# Patient Record
Sex: Female | Born: 1960 | Race: Black or African American | Hispanic: No | Smoking: Former smoker
Health system: Southern US, Community
[De-identification: ages and names within clinical notes are randomized; demographics above are authoritative.]

## PROBLEM LIST (undated history)

## (undated) DIAGNOSIS — E785 Hyperlipidemia, unspecified: Secondary | ICD-10-CM

## (undated) DIAGNOSIS — M1990 Unspecified osteoarthritis, unspecified site: Secondary | ICD-10-CM

## (undated) DIAGNOSIS — I10 Essential (primary) hypertension: Secondary | ICD-10-CM

## (undated) DIAGNOSIS — E669 Obesity, unspecified: Secondary | ICD-10-CM

## (undated) DIAGNOSIS — F319 Bipolar disorder, unspecified: Secondary | ICD-10-CM

## (undated) HISTORY — DX: Obesity, unspecified: E66.9

## (undated) HISTORY — PX: ABDOMINAL HYSTERECTOMY: SHX81

## (undated) HISTORY — DX: Hyperlipidemia, unspecified: E78.5

## (undated) HISTORY — DX: Essential (primary) hypertension: I10

## (undated) HISTORY — DX: Bipolar disorder, unspecified: F31.9

## (undated) HISTORY — DX: Unspecified osteoarthritis, unspecified site: M19.90

---

## 1998-11-03 ENCOUNTER — Emergency Department (HOSPITAL_COMMUNITY): Admission: EM | Admit: 1998-11-03 | Discharge: 1998-11-03 | Payer: Self-pay | Admitting: Emergency Medicine

## 1998-11-03 ENCOUNTER — Encounter: Payer: Self-pay | Admitting: Emergency Medicine

## 2000-09-01 ENCOUNTER — Emergency Department (HOSPITAL_COMMUNITY): Admission: EM | Admit: 2000-09-01 | Discharge: 2000-09-01 | Payer: Self-pay | Admitting: Internal Medicine

## 2000-10-05 ENCOUNTER — Emergency Department (HOSPITAL_COMMUNITY): Admission: EM | Admit: 2000-10-05 | Discharge: 2000-10-05 | Payer: Self-pay | Admitting: Emergency Medicine

## 2002-08-02 ENCOUNTER — Emergency Department (HOSPITAL_COMMUNITY): Admission: EM | Admit: 2002-08-02 | Discharge: 2002-08-02 | Payer: Self-pay | Admitting: *Deleted

## 2002-08-02 ENCOUNTER — Encounter: Payer: Self-pay | Admitting: *Deleted

## 2004-03-12 ENCOUNTER — Emergency Department (HOSPITAL_COMMUNITY): Admission: EM | Admit: 2004-03-12 | Discharge: 2004-03-13 | Payer: Self-pay | Admitting: Emergency Medicine

## 2004-11-07 ENCOUNTER — Emergency Department (HOSPITAL_COMMUNITY): Admission: EM | Admit: 2004-11-07 | Discharge: 2004-11-07 | Payer: Self-pay | Admitting: Family Medicine

## 2005-12-18 ENCOUNTER — Emergency Department (HOSPITAL_COMMUNITY): Admission: EM | Admit: 2005-12-18 | Discharge: 2005-12-18 | Payer: Self-pay | Admitting: Emergency Medicine

## 2005-12-23 ENCOUNTER — Emergency Department (HOSPITAL_COMMUNITY): Admission: EM | Admit: 2005-12-23 | Discharge: 2005-12-23 | Payer: Self-pay | Admitting: Emergency Medicine

## 2005-12-24 ENCOUNTER — Emergency Department (HOSPITAL_COMMUNITY): Admission: EM | Admit: 2005-12-24 | Discharge: 2005-12-24 | Payer: Self-pay | Admitting: Emergency Medicine

## 2006-01-07 ENCOUNTER — Emergency Department (HOSPITAL_COMMUNITY): Admission: EM | Admit: 2006-01-07 | Discharge: 2006-01-07 | Payer: Self-pay | Admitting: Family Medicine

## 2006-02-19 ENCOUNTER — Emergency Department (HOSPITAL_COMMUNITY): Admission: EM | Admit: 2006-02-19 | Discharge: 2006-02-19 | Payer: Self-pay | Admitting: Emergency Medicine

## 2006-05-05 ENCOUNTER — Emergency Department (HOSPITAL_COMMUNITY): Admission: EM | Admit: 2006-05-05 | Discharge: 2006-05-05 | Payer: Self-pay | Admitting: Family Medicine

## 2006-06-05 ENCOUNTER — Emergency Department (HOSPITAL_COMMUNITY): Admission: EM | Admit: 2006-06-05 | Discharge: 2006-06-05 | Payer: Self-pay | Admitting: Family Medicine

## 2006-06-28 ENCOUNTER — Emergency Department (HOSPITAL_COMMUNITY): Admission: EM | Admit: 2006-06-28 | Discharge: 2006-06-28 | Payer: Self-pay | Admitting: Emergency Medicine

## 2006-07-02 ENCOUNTER — Ambulatory Visit: Payer: Self-pay | Admitting: Internal Medicine

## 2006-09-21 DIAGNOSIS — I10 Essential (primary) hypertension: Secondary | ICD-10-CM | POA: Insufficient documentation

## 2006-09-21 DIAGNOSIS — F3160 Bipolar disorder, current episode mixed, unspecified: Secondary | ICD-10-CM | POA: Insufficient documentation

## 2006-09-21 DIAGNOSIS — M1990 Unspecified osteoarthritis, unspecified site: Secondary | ICD-10-CM | POA: Insufficient documentation

## 2007-04-04 ENCOUNTER — Emergency Department (HOSPITAL_COMMUNITY): Admission: EM | Admit: 2007-04-04 | Discharge: 2007-04-04 | Payer: Self-pay | Admitting: Emergency Medicine

## 2007-05-14 ENCOUNTER — Emergency Department (HOSPITAL_COMMUNITY): Admission: EM | Admit: 2007-05-14 | Discharge: 2007-05-14 | Payer: Self-pay | Admitting: Emergency Medicine

## 2007-06-21 ENCOUNTER — Encounter (INDEPENDENT_AMBULATORY_CARE_PROVIDER_SITE_OTHER): Payer: Self-pay | Admitting: *Deleted

## 2007-06-21 ENCOUNTER — Ambulatory Visit: Payer: Self-pay | Admitting: *Deleted

## 2007-06-21 DIAGNOSIS — M549 Dorsalgia, unspecified: Secondary | ICD-10-CM | POA: Insufficient documentation

## 2007-06-22 ENCOUNTER — Ambulatory Visit: Payer: Self-pay | Admitting: Hospitalist

## 2007-06-22 ENCOUNTER — Encounter (INDEPENDENT_AMBULATORY_CARE_PROVIDER_SITE_OTHER): Payer: Self-pay | Admitting: Infectious Diseases

## 2007-06-22 ENCOUNTER — Ambulatory Visit (HOSPITAL_COMMUNITY): Admission: RE | Admit: 2007-06-22 | Discharge: 2007-06-22 | Payer: Self-pay | Admitting: Hospitalist

## 2007-06-24 LAB — CONVERTED CEMR LAB
Anti Nuclear Antibody(ANA): NEGATIVE
Cholesterol: 186 mg/dL (ref 0–200)
Glucose, Bld: 94 mg/dL (ref 70–99)
HDL: 35 mg/dL — ABNORMAL LOW (ref >39)
LDL Cholesterol: 121 mg/dL — ABNORMAL HIGH (ref 0–99)
Rheumatoid fact SerPl-aCnc: <20 intl units/mL (ref 0–20)
Sed Rate: 20 mm/hr (ref 0–22)
TSH: 1.328 microintl units/mL (ref 0.350–5.50)
Total CHOL/HDL Ratio: 5.3
Triglycerides: 150 mg/dL — ABNORMAL HIGH (ref <150)
VLDL: 30 mg/dL (ref 0–40)

## 2007-07-15 ENCOUNTER — Telehealth (INDEPENDENT_AMBULATORY_CARE_PROVIDER_SITE_OTHER): Payer: Self-pay | Admitting: *Deleted

## 2007-07-19 ENCOUNTER — Encounter (INDEPENDENT_AMBULATORY_CARE_PROVIDER_SITE_OTHER): Payer: Self-pay | Admitting: *Deleted

## 2007-07-19 DIAGNOSIS — E538 Deficiency of other specified B group vitamins: Secondary | ICD-10-CM | POA: Insufficient documentation

## 2007-08-06 ENCOUNTER — Ambulatory Visit: Payer: Self-pay | Admitting: Internal Medicine

## 2007-08-06 ENCOUNTER — Encounter (INDEPENDENT_AMBULATORY_CARE_PROVIDER_SITE_OTHER): Payer: Self-pay | Admitting: *Deleted

## 2007-08-06 DIAGNOSIS — E782 Mixed hyperlipidemia: Secondary | ICD-10-CM

## 2007-08-06 LAB — CONVERTED CEMR LAB
HCT: 38 % (ref 36.0–46.0)
Hemoglobin: 12.5 g/dL (ref 12.0–15.0)
MCHC: 32.9 g/dL (ref 30.0–36.0)
MCV: 89 fL (ref 78.0–100.0)
Platelets: 314 10*3/uL (ref 150–400)
RBC: 4.27 M/uL (ref 3.87–5.11)
RDW: 13.4 % (ref 11.5–15.5)
Vitamin B-12: >2000 pg/mL — ABNORMAL HIGH (ref 211–911)
WBC: 4.6 10*3/uL (ref 4.0–10.5)

## 2007-09-06 ENCOUNTER — Ambulatory Visit: Payer: Self-pay | Admitting: Internal Medicine

## 2007-10-07 ENCOUNTER — Ambulatory Visit: Payer: Self-pay | Admitting: Internal Medicine

## 2007-11-17 ENCOUNTER — Encounter (INDEPENDENT_AMBULATORY_CARE_PROVIDER_SITE_OTHER): Payer: Self-pay | Admitting: Internal Medicine

## 2007-11-17 ENCOUNTER — Ambulatory Visit: Payer: Self-pay | Admitting: Infectious Diseases

## 2007-11-17 DIAGNOSIS — J069 Acute upper respiratory infection, unspecified: Secondary | ICD-10-CM | POA: Insufficient documentation

## 2007-11-17 LAB — CONVERTED CEMR LAB
BUN: 11 mg/dL (ref 6–23)
CO2: 26 meq/L (ref 19–32)
Calcium: 9.8 mg/dL (ref 8.4–10.5)
Chloride: 101 meq/L (ref 96–112)
Creatinine, Ser: 0.91 mg/dL (ref 0.40–1.20)
Glucose, Bld: 102 mg/dL — ABNORMAL HIGH (ref 70–99)
Potassium: 3.4 meq/L — ABNORMAL LOW (ref 3.5–5.3)
Sodium: 140 meq/L (ref 135–145)
Streptococcus, Group A Screen (Direct): NEGATIVE
TSH: 0.731 microintl units/mL (ref 0.350–5.50)

## 2007-12-23 ENCOUNTER — Ambulatory Visit: Payer: Self-pay | Admitting: Internal Medicine

## 2008-01-05 ENCOUNTER — Emergency Department (HOSPITAL_COMMUNITY): Admission: EM | Admit: 2008-01-05 | Discharge: 2008-01-05 | Payer: Self-pay | Admitting: Family Medicine

## 2008-03-28 ENCOUNTER — Emergency Department (HOSPITAL_COMMUNITY): Admission: EM | Admit: 2008-03-28 | Discharge: 2008-03-28 | Payer: Self-pay | Admitting: Emergency Medicine

## 2008-05-20 ENCOUNTER — Emergency Department (HOSPITAL_COMMUNITY): Admission: EM | Admit: 2008-05-20 | Discharge: 2008-05-20 | Payer: Self-pay | Admitting: Emergency Medicine

## 2008-05-25 ENCOUNTER — Emergency Department (HOSPITAL_COMMUNITY): Admission: EM | Admit: 2008-05-25 | Discharge: 2008-05-26 | Payer: Self-pay | Admitting: Emergency Medicine

## 2008-05-28 ENCOUNTER — Emergency Department (HOSPITAL_COMMUNITY): Admission: EM | Admit: 2008-05-28 | Discharge: 2008-05-28 | Payer: Self-pay | Admitting: Emergency Medicine

## 2008-05-29 ENCOUNTER — Telehealth: Payer: Self-pay | Admitting: Internal Medicine

## 2008-05-30 ENCOUNTER — Ambulatory Visit: Payer: Self-pay | Admitting: Internal Medicine

## 2008-08-07 ENCOUNTER — Ambulatory Visit: Payer: Self-pay | Admitting: Internal Medicine

## 2009-01-03 ENCOUNTER — Telehealth (INDEPENDENT_AMBULATORY_CARE_PROVIDER_SITE_OTHER): Payer: Self-pay | Admitting: *Deleted

## 2009-01-05 ENCOUNTER — Encounter (INDEPENDENT_AMBULATORY_CARE_PROVIDER_SITE_OTHER): Payer: Self-pay | Admitting: Internal Medicine

## 2009-01-05 ENCOUNTER — Encounter (INDEPENDENT_AMBULATORY_CARE_PROVIDER_SITE_OTHER): Payer: Self-pay | Admitting: *Deleted

## 2009-01-05 ENCOUNTER — Ambulatory Visit: Payer: Self-pay | Admitting: Infectious Disease

## 2009-01-05 DIAGNOSIS — G4733 Obstructive sleep apnea (adult) (pediatric): Secondary | ICD-10-CM

## 2009-01-05 DIAGNOSIS — K219 Gastro-esophageal reflux disease without esophagitis: Secondary | ICD-10-CM

## 2009-01-09 LAB — CONVERTED CEMR LAB
ALT: 18 units/L (ref 0–35)
AST: 19 units/L (ref 0–37)
Albumin: 4.9 g/dL (ref 3.5–5.2)
Alkaline Phosphatase: 106 units/L (ref 39–117)
BUN: 10 mg/dL (ref 6–23)
Bilirubin Urine: NEGATIVE
CO2: 25 meq/L (ref 19–32)
Calcium: 10.7 mg/dL — ABNORMAL HIGH (ref 8.4–10.5)
Chloride: 100 meq/L (ref 96–112)
Cholesterol: 240 mg/dL — ABNORMAL HIGH (ref 0–200)
Creatinine, Ser: 0.9 mg/dL (ref 0.40–1.20)
GFR calc Af Amer: >60 mL/min (ref >60)
GFR calc non Af Amer: >60 mL/min (ref >60)
Glucose, Bld: 82 mg/dL (ref 70–99)
HCT: 40 % (ref 36.0–46.0)
HDL: 41 mg/dL (ref >39)
Hemoglobin, Urine: NEGATIVE
Hemoglobin: 13.2 g/dL (ref 12.0–15.0)
Ketones, ur: NEGATIVE mg/dL
LDL Cholesterol: 143 mg/dL — ABNORMAL HIGH (ref 0–99)
Leukocytes, UA: NEGATIVE
MCHC: 33 g/dL (ref 30.0–36.0)
MCV: 87.3 fL (ref 78.0–100.0)
Nitrite: NEGATIVE
Platelets: 361 10*3/uL (ref 150–400)
Potassium: 3.6 meq/L (ref 3.5–5.3)
Protein, ur: NEGATIVE mg/dL
RBC: 4.58 M/uL (ref 3.87–5.11)
RDW: 13.9 % (ref 11.5–15.5)
Sodium: 138 meq/L (ref 135–145)
Specific Gravity, Urine: 1.007 (ref 1.005–1.030)
TSH: 0.799 microintl units/mL (ref 0.350–4.500)
Total Bilirubin: 0.4 mg/dL (ref 0.3–1.2)
Total CHOL/HDL Ratio: 5.9
Total Protein: 8.3 g/dL (ref 6.0–8.3)
Triglycerides: 282 mg/dL — ABNORMAL HIGH (ref <150)
Urine Glucose: NEGATIVE mg/dL
Urobilinogen, UA: 0.2 (ref 0.0–1.0)
VLDL: 56 mg/dL — ABNORMAL HIGH (ref 0–40)
WBC: 6.6 10*3/uL (ref 4.0–10.5)
pH: 5.5 (ref 5.0–8.0)

## 2009-01-10 ENCOUNTER — Telehealth (INDEPENDENT_AMBULATORY_CARE_PROVIDER_SITE_OTHER): Payer: Self-pay | Admitting: *Deleted

## 2009-01-10 ENCOUNTER — Telehealth: Payer: Self-pay | Admitting: *Deleted

## 2009-01-11 ENCOUNTER — Telehealth: Payer: Self-pay | Admitting: *Deleted

## 2009-01-31 ENCOUNTER — Ambulatory Visit (HOSPITAL_BASED_OUTPATIENT_CLINIC_OR_DEPARTMENT_OTHER): Admission: RE | Admit: 2009-01-31 | Discharge: 2009-01-31 | Payer: Self-pay | Admitting: Internal Medicine

## 2009-01-31 ENCOUNTER — Encounter (INDEPENDENT_AMBULATORY_CARE_PROVIDER_SITE_OTHER): Payer: Self-pay | Admitting: *Deleted

## 2009-02-20 ENCOUNTER — Telehealth: Payer: Self-pay | Admitting: *Deleted

## 2009-03-10 ENCOUNTER — Emergency Department (HOSPITAL_COMMUNITY): Admission: EM | Admit: 2009-03-10 | Discharge: 2009-03-10 | Payer: Self-pay | Admitting: Emergency Medicine

## 2009-10-12 ENCOUNTER — Telehealth: Payer: Self-pay | Admitting: Internal Medicine

## 2009-10-12 ENCOUNTER — Ambulatory Visit: Payer: Self-pay | Admitting: Internal Medicine

## 2009-10-12 DIAGNOSIS — R059 Cough, unspecified: Secondary | ICD-10-CM | POA: Insufficient documentation

## 2009-10-12 DIAGNOSIS — R05 Cough: Secondary | ICD-10-CM

## 2009-10-29 ENCOUNTER — Telehealth: Payer: Self-pay | Admitting: Internal Medicine

## 2009-10-30 ENCOUNTER — Telehealth (INDEPENDENT_AMBULATORY_CARE_PROVIDER_SITE_OTHER): Payer: Self-pay | Admitting: Internal Medicine

## 2009-11-06 ENCOUNTER — Ambulatory Visit: Payer: Self-pay | Admitting: Internal Medicine

## 2009-11-18 ENCOUNTER — Emergency Department (HOSPITAL_COMMUNITY): Admission: EM | Admit: 2009-11-18 | Discharge: 2009-11-19 | Payer: Self-pay | Admitting: Emergency Medicine

## 2009-11-22 ENCOUNTER — Encounter (INDEPENDENT_AMBULATORY_CARE_PROVIDER_SITE_OTHER): Payer: Self-pay | Admitting: *Deleted

## 2009-12-03 ENCOUNTER — Encounter (INDEPENDENT_AMBULATORY_CARE_PROVIDER_SITE_OTHER): Payer: Self-pay

## 2009-12-04 ENCOUNTER — Ambulatory Visit: Payer: Self-pay | Admitting: Gastroenterology

## 2009-12-04 ENCOUNTER — Encounter: Admission: RE | Admit: 2009-12-04 | Discharge: 2009-12-04 | Payer: Self-pay | Admitting: Internal Medicine

## 2009-12-10 ENCOUNTER — Encounter: Admission: RE | Admit: 2009-12-10 | Discharge: 2009-12-10 | Payer: Self-pay | Admitting: Internal Medicine

## 2010-01-24 ENCOUNTER — Ambulatory Visit: Payer: Self-pay | Admitting: Gastroenterology

## 2010-01-29 ENCOUNTER — Encounter: Payer: Self-pay | Admitting: Gastroenterology

## 2010-01-30 ENCOUNTER — Ambulatory Visit: Payer: Self-pay | Admitting: Internal Medicine

## 2010-01-30 ENCOUNTER — Encounter: Payer: Self-pay | Admitting: *Deleted

## 2010-04-26 ENCOUNTER — Encounter: Payer: Self-pay | Admitting: Internal Medicine

## 2010-04-29 ENCOUNTER — Encounter: Payer: Self-pay | Admitting: Internal Medicine

## 2010-04-29 ENCOUNTER — Ambulatory Visit: Payer: Self-pay | Admitting: Internal Medicine

## 2010-04-29 LAB — CONVERTED CEMR LAB
ALT: 21 units/L (ref 0–35)
AST: 22 units/L (ref 0–37)
Albumin: 4.6 g/dL (ref 3.5–5.2)
Alkaline Phosphatase: 104 units/L (ref 39–117)
BUN: 14 mg/dL (ref 6–23)
CO2: 26 meq/L (ref 19–32)
Calcium: 9.6 mg/dL (ref 8.4–10.5)
Chlamydia, Swab/Urine, PCR: NEGATIVE
Chloride: 99 meq/L (ref 96–112)
Cholesterol: 153 mg/dL (ref 0–200)
Creatinine, Ser: 0.9 mg/dL (ref 0.40–1.20)
GC Probe Amp, Urine: NEGATIVE
Glucose, Bld: 105 mg/dL — ABNORMAL HIGH (ref 70–99)
HCV Ab: NEGATIVE
HDL: 40 mg/dL (ref >39)
Hep B Core Total Ab: NEGATIVE
Hep B S Ab: NEGATIVE
Hepatitis B Surface Ag: NEGATIVE
LDL Cholesterol: 96 mg/dL (ref 0–99)
Potassium: 3.9 meq/L (ref 3.5–5.3)
Sodium: 137 meq/L (ref 135–145)
TSH: 0.584 microintl units/mL (ref 0.350–4.5)
Total Bilirubin: 0.5 mg/dL (ref 0.3–1.2)
Total CHOL/HDL Ratio: 3.8
Total Protein: 7.2 g/dL (ref 6.0–8.3)
Triglycerides: 87 mg/dL (ref <150)
VLDL: 17 mg/dL (ref 0–40)

## 2010-06-05 ENCOUNTER — Emergency Department (HOSPITAL_COMMUNITY): Admission: EM | Admit: 2010-06-05 | Discharge: 2010-06-06 | Payer: Self-pay | Admitting: Emergency Medicine

## 2010-06-12 ENCOUNTER — Encounter: Admission: RE | Admit: 2010-06-12 | Discharge: 2010-06-12 | Payer: Self-pay | Admitting: Internal Medicine

## 2010-06-26 ENCOUNTER — Ambulatory Visit: Payer: Self-pay | Admitting: Internal Medicine

## 2010-06-26 ENCOUNTER — Telehealth: Payer: Self-pay | Admitting: *Deleted

## 2010-06-26 DIAGNOSIS — F3289 Other specified depressive episodes: Secondary | ICD-10-CM | POA: Insufficient documentation

## 2010-06-26 DIAGNOSIS — F329 Major depressive disorder, single episode, unspecified: Secondary | ICD-10-CM

## 2010-07-28 ENCOUNTER — Emergency Department (HOSPITAL_COMMUNITY)
Admission: EM | Admit: 2010-07-28 | Discharge: 2010-07-28 | Payer: Self-pay | Source: Home / Self Care | Admitting: Emergency Medicine

## 2010-09-06 ENCOUNTER — Emergency Department (HOSPITAL_COMMUNITY)
Admission: EM | Admit: 2010-09-06 | Discharge: 2010-09-06 | Payer: Self-pay | Source: Home / Self Care | Admitting: Emergency Medicine

## 2010-09-16 LAB — CBC
HCT: 35.7 % — ABNORMAL LOW (ref 36.0–46.0)
Hemoglobin: 11.6 g/dL — ABNORMAL LOW (ref 12.0–15.0)
MCH: 27.8 pg (ref 26.0–34.0)
MCHC: 32.5 g/dL (ref 30.0–36.0)
MCV: 85.6 fL (ref 78.0–100.0)
Platelets: 279 10*3/uL (ref 150–400)
RBC: 4.17 MIL/uL (ref 3.87–5.11)
RDW: 13.6 % (ref 11.5–15.5)
WBC: 5.9 10*3/uL (ref 4.0–10.5)

## 2010-09-16 LAB — POCT I-STAT, CHEM 8
BUN: 11 mg/dL (ref 6–23)
Calcium, Ion: 1.12 mmol/L (ref 1.12–1.32)
Chloride: 100 mEq/L (ref 96–112)
Creatinine, Ser: 1.1 mg/dL (ref 0.4–1.2)
Glucose, Bld: 87 mg/dL (ref 70–99)
HCT: 39 % (ref 36.0–46.0)
Hemoglobin: 13.3 g/dL (ref 12.0–15.0)
Potassium: 3.2 mEq/L — ABNORMAL LOW (ref 3.5–5.1)
Sodium: 139 mEq/L (ref 135–145)
TCO2: 32 mmol/L (ref 0–100)

## 2010-09-16 LAB — DIFFERENTIAL
Basophils Absolute: 0 10*3/uL (ref 0.0–0.1)
Basophils Relative: 0 % (ref 0–1)
Eosinophils Absolute: 0.1 10*3/uL (ref 0.0–0.7)
Eosinophils Relative: 2 % (ref 0–5)
Lymphocytes Relative: 54 % — ABNORMAL HIGH (ref 12–46)
Lymphs Abs: 3.2 10*3/uL (ref 0.7–4.0)
Monocytes Absolute: 0.5 10*3/uL (ref 0.1–1.0)
Monocytes Relative: 9 % (ref 3–12)
Neutro Abs: 2.1 10*3/uL (ref 1.7–7.7)
Neutrophils Relative %: 35 % — ABNORMAL LOW (ref 43–77)

## 2010-09-16 LAB — COMPREHENSIVE METABOLIC PANEL
ALT: 27 U/L (ref 0–35)
AST: 27 U/L (ref 0–37)
Albumin: 4 g/dL (ref 3.5–5.2)
Alkaline Phosphatase: 96 U/L (ref 39–117)
BUN: 10 mg/dL (ref 6–23)
CO2: 30 mEq/L (ref 19–32)
Calcium: 9.7 mg/dL (ref 8.4–10.5)
Chloride: 100 mEq/L (ref 96–112)
Creatinine, Ser: 1.05 mg/dL (ref 0.4–1.2)
GFR calc Af Amer: >60 mL/min (ref >60)
GFR calc non Af Amer: 55 mL/min — ABNORMAL LOW (ref >60)
Glucose, Bld: 91 mg/dL (ref 70–99)
Potassium: 3.1 mEq/L — ABNORMAL LOW (ref 3.5–5.1)
Sodium: 139 mEq/L (ref 135–145)
Total Bilirubin: 0.6 mg/dL (ref 0.3–1.2)
Total Protein: 7.5 g/dL (ref 6.0–8.3)

## 2010-09-16 LAB — POCT CARDIAC MARKERS
CKMB, poc: <1 ng/mL — ABNORMAL LOW (ref 1.0–8.0)
Myoglobin, poc: 73.1 ng/mL (ref 12–200)
Troponin i, poc: <0.05 ng/mL (ref 0.00–0.09)

## 2010-10-03 NOTE — Assessment & Plan Note (Signed)
Summary: per dr Midwife, chills,achy,cough,stiff neck x 3days/pcp-rio/hla   Vital Signs:  Patient profile:   50 year old female Height:      64 inches Weight:      209.3 pounds BMI:     36.06 O2 Sat:      97 % Temp:     100.3 degrees F oral Pulse rate:   88 / minute BP sitting:   104 / 73  (right arm)  Vitals Entered By: Filomena Jungling NT II (October 12, 2009 9:58 AM) CC: FEELING SICK SINCE NWEDNESDAY, FAMILY MEMBERS SICK FIRST Nutritional Status BMI of > 30 = obese  Have you ever been in a relationship where you felt threatened, hurt or afraid?No   Does patient need assistance? Functional Status Self care Ambulation Normal   Primary Care Provider:  Silvestre Gunner MD  CC:  FEELING SICK SINCE NWEDNESDAY and FAMILY MEMBERS SICK FIRST.  History of Present Illness: 50 y/o woman with PMH of HTN, HLD, Chronic back pain from unknow cause and depression comes to the clinic for feeling sick since last 3 days. She has been having malise, sore throat, sinus congestion, anorexia and cough. Her cough is going onfor 2 yrs and it has not changed since last 3 days. She denies SOB, Chest pain or purulent nasal drainage '  Preventive Screening-Counseling & Management  Alcohol-Tobacco     Alcohol drinks/day: 0     Smoking Status: current     Smoking Cessation Counseling: yes     Packs/Day: 1/2 pack daily     Year Quit: 2006  Caffeine-Diet-Exercise     Does Patient Exercise: no     Type of exercise: JUMP ROPE / SET UPS     Times/week: 2  Current Medications (verified): 1)  Paxil Cr 25 Mg  Tb24 (Paroxetine Hcl) .... Take 1 Tablet By Mouth Once A Day 2)  Cyanocobalamin 1000 Mcg/ml Inj Soln (Cyanocobalamin) .... Inject 100 Micrograms Subcutaneously Every 4 Weeks 3)  Tussionex Pennkinetic Er 8-10 Mg/77ml  Lqcr (Chlorpheniramine-Hydrocodone) .... Take 5 Ml By Mouth Two Times A Day For 7 Days or Until The Cough Resolves. 4)  Lisinopril-Hydrochlorothiazide 20-25 Mg Tabs  (Lisinopril-Hydrochlorothiazide) .... Take 1 Tablet By Mouth Once A Day 5)  Pravachol 40 Mg Tabs (Pravastatin Sodium) .... Take One Tablet Daily For Cholesterol. 6)  Ativan 0.5 Mg Tabs (Lorazepam) .... Once Daily  Allergies (verified): No Known Drug Allergies  Review of Systems       The patient complains of anorexia, fever, and prolonged cough.  The patient denies weight loss, weight gain, vision loss, decreased hearing, hoarseness, chest pain, syncope, dyspnea on exertion, peripheral edema, headaches, hemoptysis, abdominal pain, melena, hematochezia, severe indigestion/heartburn, hematuria, incontinence, genital sores, muscle weakness, suspicious skin lesions, transient blindness, difficulty walking, depression, unusual weight change, abnormal bleeding, enlarged lymph nodes, angioedema, breast masses, and testicular masses.    Physical Exam  General:  alert, well-hydrated, and overweight-appearing.   Head:  normocephalic and atraumatic.   Eyes:  vision grossly intact, pupils equal, pupils round, pupils reactive to light, and pupils react to accomodation.   Ears:  R ear normal and L ear normal.   Nose:  no external deformity and no nasal discharge.   Mouth:  good dentition.  oropharyngeal eryhtema with enlarged tonsils.  Neck:  supple.  No cervical lymphadenopathy. no JVD Lungs:  normal respiratory effort, no intercostal retractions, no accessory muscle use, normal breath sounds, no dullness, no crackles, and no wheezes.   Heart:  normal  rate, regular rhythm, and no murmur.   Abdomen:  soft, non-tender, normal bowel sounds, and no distention.   Msk:  normal ROM, no joint tenderness, no joint swelling, no joint warmth, and no redness over joints.   Pulses:  dorsalis pedis pulses normal bilaterally  Extremities:  no edema Neurologic:  OrientedX3, cranial nerver 2-12 intact,strength good in all extremities, sensations normal to light touch, reflexes 2+ b/l, gait normal    Impression &  Recommendations:  Problem # 1:  URI (ICD-465.9) Looks like flu infection. It has been 3 days since onset of symtoms. Will treat symtomatically for 4-5 days and ask her to come back if not getting better. Will try z-pack after that.  Her updated medication list for this problem includes:    Tussionex Pennkinetic Er 8-10 Mg/19ml Lqcr (Chlorpheniramine-hydrocodone) .Marland Kitchen... Take 5 ml by mouth two times a day for 7 days or until the cough resolves.  Instructed on symptomatic treatment. Call if symptoms persist or worsen.   Problem # 2:  COUGH (ICD-786.2) Smoking with possible GERD and Lisinopril effect. Will prescribe chantix as she wants to quit now. Also, she has not been taking protonix prescribed in 5/10. So will give another prescription and emphasized regualar use. Will change lisinopril with losartan at next visit if still no improvemt. Can also try something for post-nasal drip. Sleep study did not warrant need for CPAP at this time. May be she also has asthamatic component but she is not wheezing or SOB today. Will prescribe albuterol if cough persists.   Problem # 3:  HYPERLIPIDEMIA (ICD-272.2) Not fasting. Will check FLP at next visit after a month,   Her updated medication list for this problem includes:    Pravachol 40 Mg Tabs (Pravastatin sodium) .Marland Kitchen... Take one tablet daily for cholesterol.  Problem # 4:  BACKACHE NOS (ICD-724.5) Am not sure what this is from. She describes it as going on since years. An ED physician told her that it is from prolapse post hysterectomy suregery. May be it is post hystreectomy pain but her hysterectomy was all the way back in 1992. MAy be its  DJD but Her last 4-view lumbar spine was in 06/2007 for back pain which was normal. There was not disck space reduction or bony spurs.  She was intially precribed darvocet for this pain and is requesting something today. Will try tramadol and will get pain contract signed at next visit if she feels better with that. It  seem pain medication will be a long term issue.  Will refer to PT after she gets over her current illness.   The following medications were removed from the medication list:    Propoxyphene N-apap 100-650 Mg Tabs (Propoxyphene n-apap) .Marland Kitchen... Take 1 tablet by mouth three times a day Her updated medication list for this problem includes:    Tramadol Hcl 50 Mg Tabs (Tramadol hcl) .Marland Kitchen..Marland Kitchen Two times a day  Problem # 5:  HYPERTENSION (ICD-401.9) Well controlled. Continue with lisinopril-HCTZ  Her updated medication list for this problem includes:    Lisinopril-hydrochlorothiazide 20-25 Mg Tabs (Lisinopril-hydrochlorothiazide) .Marland Kitchen... 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  Controlled.   Complete Medication List: 1)  Paxil Cr 25 Mg Tb24 (Paroxetine hcl) .... Take 1 tablet by mouth once a day 2)  Cyanocobalamin 1000 Mcg/ml Inj Soln (Cyanocobalamin) .... Inject 100 micrograms subcutaneously every 4 weeks 3)  Tussionex Pennkinetic  Er 8-10 Mg/61ml Lqcr (Chlorpheniramine-hydrocodone) .... Take 5 ml by mouth two times a day for 7 days or until the cough resolves. 4)  Lisinopril-hydrochlorothiazide 20-25 Mg Tabs (Lisinopril-hydrochlorothiazide) .... Take 1 tablet by mouth once a day 5)  Pravachol 40 Mg Tabs (Pravastatin sodium) .... Take one tablet daily for cholesterol. 6)  Ativan 0.5 Mg Tabs (Lorazepam) .... Once daily 7)  Chantix 0.5 Mg Tabs (Varenicline tartrate) .... Day 1-3: 2 times daily,   day 4 onwards- 1 daily 8)  Tramadol Hcl 50 Mg Tabs (Tramadol hcl) .... Two times a day 9)  Pantoprazole Sodium 40 Mg Tbec (Pantoprazole sodium) .... Once daily  Other Orders: Mammogram (Screening) (Mammo)  Patient Instructions: 1)  Please schedule a follow-up appointment in 1 month. Come fasting at that time.  2)  Tobacco is very bad for your health and your loved ones! You Should stop smoking!. 3)  Stop  Smoking Tips: Choose a Quit date. Cut down before the Quit date. decide what you will do as a substitute when you feel the urge to smoke(gum,toothpick,exercise). 4)  It is important that you exercise regularly at least 20 minutes 5 times a week. If you develop chest pain, have severe difficulty breathing, or feel very tired , stop exercising immediately and seek medical attention. 5)  You need to lose weight. Consider a lower calorie diet and regular exercise.  6)  Get plenty of rest, drink lots of clear liquids, and use Tylenol or Ibuprofen for fever and comfort. Return in 7-10 days if you're not better:sooner if you're feeling worse. 7)  Take 650-1000mg  of Tylenol every 4-6 hours as needed for relief of pain or comfort of fever AVOID taking more than 4000mg   in a 24 hour period (can cause liver damage in higher doses). 8)  Take 400-600mg  of Ibuprofen (Advil, Motrin) with food every 4-6 hours as needed for relief of pain or comfort of fever. Prescriptions: PANTOPRAZOLE SODIUM 40 MG TBEC (PANTOPRAZOLE SODIUM) once daily  #30 x 3   Entered and Authorized by:   Bethel Born MD   Signed by:   Bethel Born MD on 10/12/2009   Method used:   Electronically to        [facility] [phone]* (retail)       [address]       [place], [place]  [address]       Ph: [phone]       Fax: [phone]   RxID:   [id number] TRAMADOL HCL 50 MG TABS (TRAMADOL HCL) two times a day  #60 x 0   Entered and Authorized by:   Bethel Born MD   Signed by:   Bethel Born MD on 10/12/2009   Method used:   Electronically to        [facility] [phone]* (retail)       [address]       [place], [place]  [address]       Ph: [phone]       Fax: [phone]   RxID:   [id number] CHANTIX 0.5 MG TABS (VARENICLINE TARTRATE) Day 1-3: 2 times daily,   Day 4 onwards- 1 daily  #60 x 0   Entered and Authorized by:   Bethel Born MD   Signed by:   Bethel Born MD on 10/12/2009   Method used:    Electronically to        [facility] [phone]* (retail)       [address]       [place], [place]  [address]       Ph: [phone]       Fax: [phone]   RxID:   [id number]   Prevention & Chronic Care Immunizations   Influenza vaccine: Not documented   Influenza vaccine deferral: Refused  (10/12/2009)    Tetanus booster: Not documented   Td booster deferral: Deferred  (10/12/2009)    Pneumococcal vaccine: Not documented    Immunization comments:  flu and tetanus shot at next visit.   Other Screening   Pap smear: Not documented   Pap smear action/deferral: Not indicated S/P hysterectomy  (10/12/2009)    Mammogram: Not documented   Mammogram action/deferral: Ordered  (10/12/2009)   Smoking status: current  (10/12/2009)   Smoking cessation counseling: yes  (10/12/2009)  Lipids   Total Cholesterol: 240  (01/05/2009)   LDL: 143  (01/05/2009)   LDL Direct: Not documented   HDL: 41  (01/05/2009)   Triglycerides: 282  (01/05/2009)    SGOT (AST): 19  (01/05/2009)   SGPT (ALT): 18  (01/05/2009)   Alkaline phosphatase: 106  (01/05/2009)   Total bilirubin: 0.4  (01/05/2009)    Lipid flowsheet reviewed?: Yes   Progress toward LDL goal: Unchanged  Hypertension   Last Blood Pressure: 104 / 73  (10/12/2009)   Serum creatinine: 0.90  (01/05/2009)   Serum potassium 3.6  (01/05/2009)    Hypertension flowsheet reviewed?: Yes   Progress toward BP goal: At goal  Self-Management Support :   Personal Goals (by the next clinic visit) :      Personal blood pressure goal: 140/90  (10/12/2009)     Personal LDL goal: 130  (10/12/2009)    Patient will work on the following items until the next clinic visit to reach self-care goals:     Medications and monitoring: take my medicines every day  (10/12/2009)     Eating: drink diet soda or water instead of juice or soda, use fresh or frozen vegetables, eat foods that are low in salt, eat baked foods instead of fried foods   (10/12/2009)    Hypertension self-management support: Written self-care plan  (10/12/2009)   Hypertension self-care plan printed.    Lipid self-management support: Not documented    Nursing Instructions: Schedule screening mammogram (see order)

## 2010-10-03 NOTE — Procedures (Signed)
Summary: Colonoscopy  Patient: Vicki Mayo Note: All result statuses are Final unless otherwise noted.  Tests: (1) Colonoscopy (COL)   COL Colonoscopy           DONE (C)      [facility]     [address]     [place], [place]  [address]           COLONOSCOPY PROCEDURE REPORT           PATIENT:  Vicki Mayo, Vicki Mayo  MR#:  [id number]     BIRTHDATE:  1960-11-12, 49 yrs. old  GENDER:  female     ENDOSCOPIST:  Judie Petit T. Russella Dar, MD, [facility]     Referred by:  Internal Medicine [facility],     PROCEDURE DATE:  01/24/2010     PROCEDURE:  Colonoscopy with hot biopsy, with snare polypectomy     ASA CLASS:  Class II     INDICATIONS:  1) Routine Risk Screening     MEDICATIONS:   Fentanyl 100 mcg IV, Versed 12 mg IV, Benadryl 50     mg IV     DESCRIPTION OF PROCEDURE:   After the risks benefits and     alternatives of the procedure were thoroughly explained, informed     consent was obtained.  Digital rectal exam was performed and     revealed no abnormalities.   The LB [id number]-[id number] endoscope     was introduced through the anus and advanced to the cecum, which     was identified by both the appendix and ileocecal valve, limited     by a tortuous colon.    The quality of the prep was good, using     MoviPrep.  The instrument was then slowly withdrawn as the colon     was fully examined.     <<PROCEDUREIMAGES>>     FINDINGS:  A sessile polyp was found in the cecum. It was 6 mm in     size. Polyp was snared without cautery. Retrieval was successful.     Three polyps were found in the sigmoid colon. They were 3 mm in     size. Polyps were removed with cold biopsy forceps and sent to     pathology.  Four polyps were found in the sigmoid colon. They were     3 - 4 mm in size. With hot biopsy forceps, the polyps were     cauterized, biopsies were obtained and sent to pathology.  This     was otherwise a normal examination of the colon. Retroflexed views     in the rectum revealed no  abnormalities. The time to cecum =  9     minutes. The scope was then withdrawn (time =  12  min) from the     patient and the procedure completed.           COMPLICATIONS:  None           ENDOSCOPIC IMPRESSION:     1) 6 mm sessile polyp in the cecum     2) 3 mm Three polyps in the sigmoid colon     3) 3 - 4 mm Four polyps in the sigmoid colon           RECOMMENDATIONS:     1) No aspirin or NSAID's for 2 weeks     2) Await pathology results     3) If the polyps removed today are adenomatous (pre-cancerous),  you will need a repeat colonoscopy in 5 years. Otherwise you     should continue to follow colorectal cancer screening guidelines     for "routine risk" patients with colonoscopy in 10 years.     Venita Lick. Russella Dar, MD, [facility]           n.     REVISED:  01/29/2010 07:59 AM     eSIGNED:   Venita Lick. Breslin Hemann at 01/29/2010 07:59 AM           Ronnald Collum, [id number]  Note: An exclamation mark (!) indicates a result that was not dispersed into the flowsheet. Document Creation Date: 01/29/2010 8:00 AM _______________________________________________________________________  (1) Order result status: Final Collection or observation date-time: 01/24/2010 13:58 Requested date-time:  Receipt date-time:  Reported date-time:  Referring Physician:   Ordering Physician: Claudette Head [phone]) Specimen Source:  Source: Launa Grill Order Number: [id number] Lab site:   Appended Document: Colonoscopy     Procedures Next Due Date:    Colonoscopy: 12/2019

## 2010-10-03 NOTE — Letter (Signed)
Summary: Previsit letter  [facility]  [address] [place], [place] [address]   Phone: [phone]  Fax: [phone]       11/22/2009 MRN: [id number]  [facility] [place] [address] [place], [place]  [address]  Dear Vicki Mayo,  Welcome to the Gastroenterology Division at [facility].    You are scheduled to see a nurse for your pre-procedure visit on 12-04-09 at 3:30p.m. on the 3rd floor at [facility], [address]  We ask that you try to arrive at our office 15 minutes prior to your appointment time to allow for check-in.  Your nurse visit will consist of discussing your medical and surgical history, your immediate family medical history, and your medications.    Please bring a complete list of all your medications or, if you prefer, bring the medication bottles and we will list them.  We will need to be aware of both prescribed and over the counter drugs.  We will need to know exact dosage information as well.  If you are on blood thinners (Coumadin, Plavix, Aggrenox, Ticlid, etc.) please call our office today/prior to your appointment, as we need to consult with your physician about holding your medication.   Please be prepared to read and sign documents such as consent forms, a financial agreement, and acknowledgement forms.  If necessary, and with your consent, a friend or relative is welcome to sit-in on the nurse visit with you.  Please bring your insurance card so that we may make a copy of it.  If your insurance requires a referral to see a specialist, please bring your referral form from your primary care physician.  No co-pay is required for this nurse visit.     If you cannot keep your appointment, please call [phone] to cancel or reschedule prior to your appointment date.  This allows [place] the opportunity to schedule an appointment for another patient in need of care.    Thank you for choosing [facility] for your medical  needs.  We appreciate the opportunity to care for you.  Please visit [place] at our website  to learn more about our practice.                     Sincerely.                                                                                                                   The Gastroenterology Division

## 2010-10-03 NOTE — Progress Notes (Signed)
Summary: walk-in/gg  Phone Note Call from Patient   Summary of Call: Pt walked into clinic with c/o sinus  pain and left ear pain for 3-4 days.   Denies fever would like to be seen.  Hard to sleep at night because of ear throbbing.  will see today Initial call taken by: Merrie Roof RN,  June 26, 2010 9:45 AM

## 2010-10-03 NOTE — Letter (Signed)
Summary: [facility] Instructions  [facility]  [address] [place], [place] [address]   Phone: [phone]  Fax: [phone]       [facility]    [birth year]    MRN: [id number]        Procedure Day /Date: 12/12/09  Wednesday     Arrival Time:  10:30am     Procedure Time:  11:30am     Location of Procedure:                    _x _  [facility] (4th Floor)                        PREPARATION FOR COLONOSCOPY WITH MOVIPREP   Starting 5 days prior to your procedure _ 4/8/11_ do not eat nuts, seeds, popcorn, corn, beans, peas,  salads, or any raw vegetables.  Do not take any fiber supplements (e.g. Metamucil, Citrucel, and Benefiber).  THE DAY BEFORE YOUR PROCEDURE         DATE:   12/11/09  DAY:  Tuesday  1.  Drink clear liquids the entire day-NO SOLID FOOD  2.  Do not drink anything colored red or purple.  Avoid juices with pulp.  No orange juice.  3.  Drink at least 64 oz. (8 glasses) of fluid/clear liquids during the day to prevent dehydration and help the prep work efficiently.  CLEAR LIQUIDS INCLUDE: Water Jello Ice Popsicles Tea (sugar ok, no milk/cream) Powdered fruit flavored drinks Coffee (sugar ok, no milk/cream) Gatorade Juice: apple, white grape, white cranberry  Lemonade Clear bullion, consomm, broth Carbonated beverages (any kind) Strained chicken noodle soup Hard Candy                             4.  In the morning, mix first dose of MoviPrep solution:    Empty 1 Pouch A and 1 Pouch B into the disposable container    Add lukewarm drinking water to the top line of the container. Mix to dissolve    Refrigerate (mixed solution should be used within 24 hrs)  5.  Begin drinking the prep at 5:00 p.m. The MoviPrep container is divided by 4 marks.   Every 15 minutes drink the solution down to the next mark (approximately 8 oz) until the full liter is complete.   6.  Follow completed prep with 16 oz of clear liquid of your choice  (Nothing red or purple).  Continue to drink clear liquids until bedtime.  7.  Before going to bed, mix second dose of MoviPrep solution:    Empty 1 Pouch A and 1 Pouch B into the disposable container    Add lukewarm drinking water to the top line of the container. Mix to dissolve    Refrigerate  THE DAY OF YOUR PROCEDURE      DATE:   12/12/09  DAY:  Wednesday  Beginning at  6:30 a.m. (5 hours before procedure):         1. Every 15 minutes, drink the solution down to the next mark (approx 8 oz) until the full liter is complete.  2. Follow completed prep with 16 oz. of clear liquid of your choice.    3. You may drink clear liquids until  9:30am  (2 HOURS BEFORE PROCEDURE).   MEDICATION INSTRUCTIONS  Unless otherwise instructed, you should take regular prescription medications with a small sip  of water   as early as possible the morning of your procedure.         OTHER INSTRUCTIONS  You will need a responsible adult at least 51 years of age to accompany you and drive you home.   This person must remain in the waiting room during your procedure.  Wear loose fitting clothing that is easily removed.  Leave jewelry and other valuables at home.  However, you may wish to bring a book to read or  an iPod/MP3 player to listen to music as you wait for your procedure to start.  Remove all body piercing jewelry and leave at home.  Total time from sign-in until discharge is approximately 2-3 hours.  You should go home directly after your procedure and rest.  You can resume normal activities the  day after your procedure.  The day of your procedure you should not:   Drive   Make legal decisions   Operate machinery   Drink alcohol   Return to work  You will receive specific instructions about eating, activities and medications before you leave.    The above instructions have been reviewed and explained to me by   Ulis Rias RN  December 04, 2009 3:48 PM     I fully  understand and can verbalize these instructions _____________________________ Date _________
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
## 2010-10-03 NOTE — Letter (Signed)
Summary: Patient Notice- Colon Biospy Results  [facility]  [address] [place], [place] [address]   Phone: [phone]  Fax: [phone]        Jan 29, 2010 MRN: [id number]    [facility] [place] [address] [place], [place]  [address]    Dear Vicki Mayo,  I am pleased to inform you that the biopsies taken during your recent colonoscopy did not show any evidence of cancer upon pathologic examination. The biopsies showed reactive changes. There was no evidence of precancerous polyps.  Continue with the treatment plan as outlined on the day of your      exam.  You should have a repeat colonoscopy examination in 10 years.  Please call us if you are having persistent problems or have questions about your condition that have not been fully answered at this time.  Sincerely,  Meryl Dare MD [facility]  This letter has been electronically signed by your physician.   Appended Document: Patient Notice- Colon Biospy Results letter mailed.
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
## 2010-10-03 NOTE — Progress Notes (Signed)
Summary: refill/gg  Phone Note Refill Request  on October 30, 2009 4:03 PM  Refills Requested: Medication #1:  PRAVACHOL 40 MG TABS Take one tablet daily for cholesterol.  Method Requested: Electronic Initial call taken by: Merrie Roof RN,  October 30, 2009 4:09 PM  Follow-up for Phone Call        Rx sent electronically to pharmacy Follow-up by: Silvestre Gunner MD,  October 30, 2009 7:47 PM    Prescriptions: PRAVACHOL 40 MG TABS (PRAVASTATIN SODIUM) Take one tablet daily for cholesterol.  #32 x 6   Entered and Authorized by:   Silvestre Gunner MD   Signed by:   Silvestre Gunner MD on 10/30/2009   Method used:   Electronically to        [facility] [phone]* (retail)       [address]       [place], [place]  [address]       Ph: [phone]       Fax: [phone]   RxID:   [id number]

## 2010-10-03 NOTE — Assessment & Plan Note (Signed)
Summary: walk-in  Pt came into clinic with c/o hypertention.   She states she was taken off all her BP meds in March and she has been checking BP's and they are elevated.  Today at [facility] BP 142/99.  I rechecked in clinic and reading was 156/96.   Will see pt today.  Merrie Roof RN  January 30, 2010 4:24 PM   Agree with plan to have patient seen today. Margarito Liner MD  January 30, 2010 5:05 PM

## 2010-10-03 NOTE — Progress Notes (Signed)
Summary: walk-in/ hla  Phone Note Call from Patient   Summary of Call: pt presents as walkin today, c/o chills/ hot flashes, cough- ongoing but worse recently, genralized achiness, stiff neck since wed, did feel bad last week but really bad since wed. otc's have not helped, took nyquil last night w/o relief, nothing makes symptoms worse. able to eat and drink. spoke w/ dr Midwife and dr Scot Dock will add to dr Scot Dock this am Initial call taken by: Marin Roberts RN,  October 12, 2009 10:01 AM  Follow-up for Phone Call        Agree Follow-up by: Blanch Media MD,  October 12, 2009 11:05 AM

## 2010-10-03 NOTE — Miscellaneous (Signed)
Summary: Lab orders  Clinical Lists Changes  Problems: Added new problem of SCREENING EXAMINATION FOR VENEREAL DISEASE (ICD-V74.5) Orders: Added new Test order of T-Comprehensive Metabolic Panel [phone]) - Signed Added new Test order of T-Lipid Profile [phone]) - Signed Added new Test order of T-GC Probe, urine [phone]) - Signed Added new Test order of T-HIV Antibody  (Reflex) [id number]) - Signed Added new Test order of T-Hepatitis C Antibody ([id number]) - Signed Added new Test order of T-Hepatitis B Surface Antigen [phone]) - Signed Added new Test order of T-Hepatitis B Surface Antibody ([id number]) - Signed Added new Test order of T-Hepatitis B Core Antibody ([id number]) - Signed Added new Test order of T- GC Chlamydia ([id number]) - Signed  Appended Document: Lab orders    Clinical Lists Changes  Orders: Added new Test order of T-TSH [phone]) - Signed

## 2010-10-03 NOTE — Progress Notes (Signed)
Summary: Prescription  Phone Note Call from Patient   Caller: Patient Call For: Andriana Casa Summary of Call: Call from pt says that when she was here 2 weeks ago was told if her cough continues that she could get an antibiotic.  She said that 2 weks ago she was coughing up yellow mucous.  Now it is light yellow.  Would like something called /sent to the [facility] on [address].  Pt said that she was told that she would not need to come  in for a visit.  No fevers.  Just the cough.Angelina Ok RN  October 29, 2009 11:56 AM  Initial call taken by: Angelina Ok RN,  October 29, 2009 11:56 AM  Follow-up for Phone Call        Please schedule a follow up appointment for the patient soon for evaluations of upper respi tract infection  Thanks Follow-up by: Bethel Born MD,  October 29, 2009 4:25 PM     Appended Document: Prescription Talked to the patient. She has some lingering cough from the viral infection. She does not seem to be having any fever or sore throar or sinus pain. She does complain of some congestion and sputum production. Asked to come to the clinic to r/o pneumina and possible antibiotic regimen.

## 2010-10-03 NOTE — Miscellaneous (Signed)
Summary: Lec previsit  Clinical Lists Changes  Medications: Added new medication of MOVIPREP 100 GM  SOLR (PEG-KCL-NACL-NASULF-NA ASC-C) As per prep instructions. - Signed Rx of MOVIPREP 100 GM  SOLR (PEG-KCL-NACL-NASULF-NA ASC-C) As per prep instructions.;  #1 x 0;  Signed;  Entered by: Ulis Rias RN;  Authorized by: Mardella Layman MD [facility];  Method used: Electronically to [facility] [phone]*, [address], [place], [place]  [address], Ph: [phone], Fax: [phone] Observations: Added new observation of NKA: T (12/04/2009 15:31)    Prescriptions: MOVIPREP 100 GM  SOLR (PEG-KCL-NACL-NASULF-NA ASC-C) As per prep instructions.  #1 x 0   Entered by:   Ulis Rias RN   Authorized by:   Mardella Layman MD [facility]   Signed by:   Ulis Rias RN on 12/04/2009   Method used:   Electronically to        [facility] [phone]* (retail)       [address]       [place], [place]  [address]       Ph: [phone]       Fax: [phone]   RxID:   [id number]
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
## 2010-10-07 ENCOUNTER — Emergency Department (HOSPITAL_COMMUNITY)
Admission: EM | Admit: 2010-10-07 | Discharge: 2010-10-07 | Disposition: A | Discharge status: Home / Self Care | Payer: Medicare Other | Attending: Emergency Medicine | Admitting: Emergency Medicine

## 2010-10-07 ENCOUNTER — Emergency Department (HOSPITAL_COMMUNITY): Payer: Medicare Other

## 2010-10-07 ENCOUNTER — Encounter (HOSPITAL_COMMUNITY): Payer: Self-pay

## 2010-10-07 DIAGNOSIS — R51 Headache: Secondary | ICD-10-CM | POA: Insufficient documentation

## 2010-10-07 DIAGNOSIS — Z79899 Other long term (current) drug therapy: Secondary | ICD-10-CM | POA: Insufficient documentation

## 2010-10-07 DIAGNOSIS — I10 Essential (primary) hypertension: Secondary | ICD-10-CM | POA: Insufficient documentation

## 2010-10-07 DIAGNOSIS — F341 Dysthymic disorder: Secondary | ICD-10-CM | POA: Insufficient documentation

## 2010-10-07 DIAGNOSIS — R209 Unspecified disturbances of skin sensation: Secondary | ICD-10-CM | POA: Insufficient documentation

## 2010-10-07 DIAGNOSIS — R269 Unspecified abnormalities of gait and mobility: Secondary | ICD-10-CM | POA: Insufficient documentation

## 2010-10-07 DIAGNOSIS — J45909 Unspecified asthma, uncomplicated: Secondary | ICD-10-CM | POA: Insufficient documentation

## 2010-10-07 LAB — BASIC METABOLIC PANEL
BUN: 7 mg/dL (ref 6–23)
Chloride: 104 mEq/L (ref 96–112)
GFR calc Af Amer: >60 mL/min (ref >60)
Potassium: 3.7 mEq/L (ref 3.5–5.1)

## 2010-10-07 LAB — CBC
Hemoglobin: 11.8 g/dL — ABNORMAL LOW (ref 12.0–15.0)
MCV: 85.1 fL (ref 78.0–100.0)
Platelets: 293 10*3/uL (ref 150–400)
RBC: 4.16 MIL/uL (ref 3.87–5.11)
WBC: 6.3 10*3/uL (ref 4.0–10.5)

## 2010-10-07 LAB — DIFFERENTIAL
Eosinophils Absolute: 0.1 10*3/uL (ref 0.0–0.7)
Lymphs Abs: 2.4 10*3/uL (ref 0.7–4.0)
Neutro Abs: 3.1 10*3/uL (ref 1.7–7.7)
Neutrophils Relative %: 49 % (ref 43–77)

## 2010-11-16 ENCOUNTER — Inpatient Hospital Stay (INDEPENDENT_AMBULATORY_CARE_PROVIDER_SITE_OTHER)
Admission: RE | Admit: 2010-11-16 | Discharge: 2010-11-16 | Disposition: A | Discharge status: Home / Self Care | Payer: Medicare Other | Source: Ambulatory Visit | Attending: Family Medicine | Admitting: Family Medicine

## 2010-11-16 DIAGNOSIS — L989 Disorder of the skin and subcutaneous tissue, unspecified: Secondary | ICD-10-CM

## 2010-12-27 ENCOUNTER — Other Ambulatory Visit: Payer: Self-pay | Admitting: Internal Medicine

## 2010-12-27 DIAGNOSIS — Z1231 Encounter for screening mammogram for malignant neoplasm of breast: Secondary | ICD-10-CM

## 2011-01-01 ENCOUNTER — Ambulatory Visit
Admission: RE | Admit: 2011-01-01 | Discharge: 2011-01-01 | Disposition: A | Discharge status: Home / Self Care | Payer: Medicare Other | Source: Ambulatory Visit | Attending: *Deleted | Admitting: *Deleted

## 2011-01-01 DIAGNOSIS — Z1231 Encounter for screening mammogram for malignant neoplasm of breast: Secondary | ICD-10-CM

## 2011-01-14 NOTE — Procedures (Signed)
Vicki Mayo, Vicki Mayo               ACCOUNT NO.:  [id number]   MEDICAL RECORD NO.:  [id number]          PATIENT TYPE:  OUT   LOCATION:  SLEEP CENTER                 [facility]ACILITY[facility]   PHYSICIAN:  Clinton D. Maple Hudson, MD, FCCP, FACPDATE OF BIRTH:  Dec 17, 1959   DATE OF STUDY:  01/31/2009                            NOCTURNAL POLYSOMNOGRAM   REFERRING PHYSICIAN:  Elby Showers, MD   INDICATION FOR STUDY:  Hypersomnia with sleep apnea.  Epworth sleepiness  score 15/24.  BMI 36.  Weight 210 pounds.  Height 64 inches.  Neck 16  inches.   HOME MEDICATIONS:  Charted and reviewed.   SLEEP ARCHITECTURE:  Total sleep time 287 minutes with sleep efficiency  68.5%.  Stage I was 15.4%, stage II 72.3%, stage III absent, REM 14.3%  of total sleep time.  Sleep latency 12 minutes, REM latency 242 minutes,  awake after sleep onset 121 minutes, arousal index 59.1.   BEDTIME MEDICATIONS:  Ativan, Darvocet at 20:30 p.m.  Ativan was  repeated at 00:22 a.m.   RESPIRATORY DATA:  Apnea-hypopnea index (AHI) 19 per hour.  Total of 91  events was scored including 4 obstructive apneas, 1 central apnea, and  86 hypopneas.  Events were more common while non-supine.  REM AHI 8.8  per hour.  An additional 154 respiratory effort related arousals were  counted as events not meeting scoring criteria of duration and oxygen  desaturation to qualify apneas or hypopneas.  This gave a cumulative  respiratory disturbance index (RDI) of 51.1 per hour.  There was  insufficient early sleep and insufficient early events to permit CPAP  titration by split protocol on the study night.   OXYGEN DATA:  Moderate to occasionally very loud snoring with oxygen  desaturation to a nadir of 88%.  Mean oxygen saturation through the  study 94.4% on room air.   CARDIAC DATA:  Sinus rhythm with PVCs.   MOVEMENT/PARASOMNIA:  Insignificant movement disturbance.  Bathroom x1.  The patient was noted to have coughing spells during the  study.   IMPRESSION/RECOMMENDATIONS:  1. Note that the patient took propoxyphene and Ativan for sleep still      with sleep efficiency only 68.5%.  Technician commented on the      patient's coughing during the study.  This invites possibility of      underlying lung disease or sleep associated reflux.  2. Moderate to moderately severe obstructive sleep apnea/hypopnea      syndrome, AHI 19 per hour, RDI 51.1 per hour.  Events were more      frequent while non-supine.  Moderate to very loud snoring with      oxygen desaturation to a nadir of 88% on room air.  3. Most of her respiratory events occurred late in the night and there      were insufficient numbers to qualify for      CPAP titration by split protocol within the necessary time limits.      Consider return for CPAP titration or evaluate for alternative      options as appropriate.      Clinton D. Maple Hudson, MD, FCCP, FACP  Diplomate, Biomedical engineer  of Sleep Medicine  Electronically Signed     CDY/MEDQ  D:  02/04/2009 19:43:24  T:  02/05/2009 07:49:46  Job:  [id number]

## 2011-01-24 ENCOUNTER — Inpatient Hospital Stay (INDEPENDENT_AMBULATORY_CARE_PROVIDER_SITE_OTHER)
Admission: RE | Admit: 2011-01-24 | Discharge: 2011-01-24 | Disposition: A | Discharge status: Home / Self Care | Payer: Medicare Other | Source: Ambulatory Visit | Attending: Family Medicine | Admitting: Family Medicine

## 2011-01-24 DIAGNOSIS — I10 Essential (primary) hypertension: Secondary | ICD-10-CM

## 2011-01-24 LAB — POCT I-STAT, CHEM 8
BUN: 8 mg/dL (ref 6–23)
Creatinine, Ser: 1 mg/dL (ref 0.4–1.2)
Potassium: 3.8 mEq/L (ref 3.5–5.1)
Sodium: 140 mEq/L (ref 135–145)

## 2011-02-03 ENCOUNTER — Encounter: Payer: Self-pay | Admitting: Internal Medicine

## 2011-02-16 ENCOUNTER — Emergency Department (HOSPITAL_COMMUNITY)
Admission: EM | Admit: 2011-02-16 | Discharge: 2011-02-16 | Disposition: A | Discharge status: Home / Self Care | Payer: Medicare Other | Attending: Emergency Medicine | Admitting: Emergency Medicine

## 2011-02-16 DIAGNOSIS — I10 Essential (primary) hypertension: Secondary | ICD-10-CM | POA: Insufficient documentation

## 2011-02-16 DIAGNOSIS — F411 Generalized anxiety disorder: Secondary | ICD-10-CM | POA: Insufficient documentation

## 2011-02-16 DIAGNOSIS — R0789 Other chest pain: Secondary | ICD-10-CM | POA: Insufficient documentation

## 2011-02-16 DIAGNOSIS — J45909 Unspecified asthma, uncomplicated: Secondary | ICD-10-CM | POA: Insufficient documentation

## 2011-02-16 DIAGNOSIS — R51 Headache: Secondary | ICD-10-CM | POA: Insufficient documentation

## 2011-02-16 DIAGNOSIS — IMO0001 Reserved for inherently not codable concepts without codable children: Secondary | ICD-10-CM | POA: Insufficient documentation

## 2011-02-16 DIAGNOSIS — R002 Palpitations: Secondary | ICD-10-CM | POA: Insufficient documentation

## 2011-02-16 DIAGNOSIS — F329 Major depressive disorder, single episode, unspecified: Secondary | ICD-10-CM | POA: Insufficient documentation

## 2011-02-16 DIAGNOSIS — F3289 Other specified depressive episodes: Secondary | ICD-10-CM | POA: Insufficient documentation

## 2011-02-16 DIAGNOSIS — Z79899 Other long term (current) drug therapy: Secondary | ICD-10-CM | POA: Insufficient documentation

## 2011-02-16 DIAGNOSIS — M549 Dorsalgia, unspecified: Secondary | ICD-10-CM | POA: Insufficient documentation

## 2011-02-16 DIAGNOSIS — R209 Unspecified disturbances of skin sensation: Secondary | ICD-10-CM | POA: Insufficient documentation

## 2011-02-20 ENCOUNTER — Ambulatory Visit (INDEPENDENT_AMBULATORY_CARE_PROVIDER_SITE_OTHER): Payer: Medicare Other | Admitting: Internal Medicine

## 2011-02-20 VITALS — BP 132/90 | HR 96 | Temp 99.5°F | Ht 64.0 in | Wt 205.6 lb

## 2011-02-20 DIAGNOSIS — F329 Major depressive disorder, single episode, unspecified: Secondary | ICD-10-CM

## 2011-02-20 DIAGNOSIS — M1990 Unspecified osteoarthritis, unspecified site: Secondary | ICD-10-CM

## 2011-02-20 DIAGNOSIS — F3160 Bipolar disorder, current episode mixed, unspecified: Secondary | ICD-10-CM

## 2011-02-20 DIAGNOSIS — I10 Essential (primary) hypertension: Secondary | ICD-10-CM

## 2011-02-20 LAB — SEDIMENTATION RATE: Sed Rate: 30 mm/hr — ABNORMAL HIGH (ref 0–22)

## 2011-02-20 MED ORDER — HYDROCHLOROTHIAZIDE 25 MG PO TABS: 25.0000 mg | ORAL_TABLET | Freq: Every day | ORAL | Status: DC

## 2011-02-20 MED ORDER — TRAMADOL HCL 50 MG PO TABS: 50.0000 mg | ORAL_TABLET | Freq: Two times a day (BID) | ORAL | Status: DC

## 2011-02-20 MED ORDER — PAROXETINE HCL ER 25 MG PO TB24: 25.0000 mg | ORAL_TABLET | Freq: Every day | ORAL | Status: DC

## 2011-02-20 MED ORDER — LORAZEPAM 0.5 MG PO TABS: 0.5000 mg | ORAL_TABLET | Freq: Every day | ORAL | Status: DC

## 2011-02-20 NOTE — Assessment & Plan Note (Signed)
Says she has cough from bloodpressure medicine, but she is not on lisinopril only on HCTZ. Asked her to continue taking it.

## 2011-02-20 NOTE — Patient Instructions (Signed)
Follow up with mental health provider Doctor will call you ONLY IF ANYTHING WRONG in the labwork Follow up in one month

## 2011-02-20 NOTE — Assessment & Plan Note (Addendum)
Provided limited supply for tramadol. Will defer to pcp for long term plan . I do not find evidence of narcotic use on continued bases. Rechecking ANA and RF after four years.

## 2011-02-20 NOTE — Progress Notes (Signed)
  Subjective:    Patient ID: Vicki Mayo, female    DOB: [birth year], 50 y.o.   MRN: [id number]  Hypertension   50 year old female with past medical history of bipolar disorder, hypertension, aftercare sleep apnea and osteoarthritis presents with number of complaints. She reports that she's having muscle knots  and muscle pains that start from her toes goes all the way up to her body. It was neck and shoulder at time. she has pressure in the chest. she has been into the emergency room multiple times. She is in tears and very distraught. She denies suicidal ideation.  Her thoughts are racing. And she has emotions which are very unstable. She asked about the results of the breast exams, lab test and  x-rays. Then she lists a number of people in her family who died from cancer, or heart attacks and she's concerned about all of them.   She has not been taking any of her psych meds and not seen her psych doctor.    Review of Systems  Unable to perform ROS Due to racy thoughts and unclear mindset.      Objective:   Physical Exam  Constitutional: She is oriented to person, place, and time. She appears well-developed and well-nourished.  HENT:  Head: Normocephalic and atraumatic.  Right Ear: External ear normal.  Left Ear: External ear normal.  Eyes: Conjunctivae and EOM are normal. Pupils are equal, round, and reactive to light.  Neck: No JVD present. No tracheal deviation present. No thyromegaly present.  Cardiovascular: Normal rate, regular rhythm and normal heart sounds.  Exam reveals no gallop.   No murmur heard. Pulmonary/Chest: No respiratory distress. She has no wheezes. She has no rales. She exhibits no tenderness.  Abdominal: Soft. Bowel sounds are normal. She exhibits no distension and no mass. There is no tenderness. There is no rebound and no guarding.  Musculoskeletal: Normal range of motion. She exhibits no edema and no tenderness.  Lymphadenopathy:    She has no cervical  adenopathy.  Neurological: She is alert and oriented to person, place, and time. She has normal reflexes. No cranial nerve deficit. Coordination normal.  Skin: No rash noted. No erythema.  Psychiatric: Thought content normal. Her mood appears anxious. Her affect is labile. Her speech is rapid and/or pressured and tangential. She is is hyperactive. Thought content is not delusional. She expresses inappropriate judgment. She exhibits a depressed mood. She expresses no homicidal and no suicidal ideation. She expresses no suicidal plans and no homicidal plans. She exhibits abnormal recent memory. She is inattentive.          Assessment & Plan:

## 2011-02-20 NOTE — Assessment & Plan Note (Signed)
Has stopped taking all of her meds and is really having bipolar manifestations. Restart all her psych meds. REfused to meet Child psychotherapist. Will make appointment with her psych doctor but will doubt it.

## 2011-02-21 LAB — ANA: Anti Nuclear Antibody(ANA): NEGATIVE

## 2011-03-21 ENCOUNTER — Ambulatory Visit: Payer: Medicare Other | Admitting: Internal Medicine

## 2011-03-26 NOTE — Progress Notes (Signed)
Addended by: Neomia Dear on: 03/26/2011 06:32 PM   Modules accepted: Orders

## 2011-04-23 ENCOUNTER — Emergency Department (HOSPITAL_COMMUNITY): Payer: Medicare Other

## 2011-04-23 ENCOUNTER — Emergency Department (HOSPITAL_COMMUNITY)
Admission: EM | Admit: 2011-04-23 | Discharge: 2011-04-23 | Disposition: A | Discharge status: Home / Self Care | Payer: Medicare Other | Attending: Emergency Medicine | Admitting: Emergency Medicine

## 2011-04-23 DIAGNOSIS — M545 Low back pain, unspecified: Secondary | ICD-10-CM | POA: Insufficient documentation

## 2011-04-23 DIAGNOSIS — M79609 Pain in unspecified limb: Secondary | ICD-10-CM | POA: Insufficient documentation

## 2011-04-23 DIAGNOSIS — J45909 Unspecified asthma, uncomplicated: Secondary | ICD-10-CM | POA: Insufficient documentation

## 2011-04-23 DIAGNOSIS — I10 Essential (primary) hypertension: Secondary | ICD-10-CM | POA: Insufficient documentation

## 2011-04-23 LAB — URINALYSIS, ROUTINE W REFLEX MICROSCOPIC
Leukocytes, UA: NEGATIVE
Protein, ur: NEGATIVE mg/dL
Urobilinogen, UA: 0.2 mg/dL (ref 0.0–1.0)

## 2011-05-12 ENCOUNTER — Other Ambulatory Visit: Payer: Self-pay | Admitting: *Deleted

## 2011-05-12 MED ORDER — HYDROCHLOROTHIAZIDE 25 MG PO TABS: 25.0000 mg | ORAL_TABLET | Freq: Every day | ORAL | Status: DC

## 2011-07-14 ENCOUNTER — Other Ambulatory Visit: Payer: Self-pay | Admitting: *Deleted

## 2011-07-15 ENCOUNTER — Telehealth: Payer: Self-pay | Admitting: *Deleted

## 2011-07-15 MED ORDER — PAROXETINE HCL ER 25 MG PO TB24: 25.0000 mg | ORAL_TABLET | Freq: Every day | ORAL | Status: DC

## 2011-07-15 MED ORDER — HYDROCHLOROTHIAZIDE 25 MG PO TABS: 25.0000 mg | ORAL_TABLET | Freq: Every day | ORAL | Status: DC

## 2011-07-15 NOTE — Telephone Encounter (Signed)
Pt calls and states her insurance will not pay for paxil cr wants plain paxil, can we change, if so please send electronically and change med list  thankyou

## 2011-07-16 MED ORDER — PAROXETINE HCL 30 MG PO TABS: 30.0000 mg | ORAL_TABLET | ORAL | Status: DC

## 2011-07-16 NOTE — Telephone Encounter (Signed)
New Rx submitted for Paxil 30mg  tabs.  The 25mg  tab formulation is not available in immediate release, so the 30mg  dose was prescribed.  Please let her know the new rx is in and the dose will be slightly different.  Thanks!

## 2011-09-12 ENCOUNTER — Other Ambulatory Visit: Payer: Self-pay | Admitting: *Deleted

## 2011-09-12 DIAGNOSIS — M1990 Unspecified osteoarthritis, unspecified site: Secondary | ICD-10-CM

## 2011-09-12 MED ORDER — TRAMADOL HCL 50 MG PO TABS: 50.0000 mg | ORAL_TABLET | Freq: Two times a day (BID) | ORAL | Status: DC | PRN

## 2011-09-12 MED ORDER — LORAZEPAM 0.5 MG PO TABS: 0.5000 mg | ORAL_TABLET | Freq: Every day | ORAL | Status: DC

## 2011-09-12 NOTE — Telephone Encounter (Signed)
I refilled enough to cover until patient's scheduled appointment next week - nurse to phone in.

## 2011-09-12 NOTE — Telephone Encounter (Signed)
Rx called in 

## 2011-09-17 ENCOUNTER — Ambulatory Visit: Payer: Medicare Other | Admitting: Internal Medicine

## 2011-11-27 ENCOUNTER — Other Ambulatory Visit: Payer: Self-pay | Admitting: Internal Medicine

## 2011-12-18 ENCOUNTER — Ambulatory Visit (INDEPENDENT_AMBULATORY_CARE_PROVIDER_SITE_OTHER): Payer: Medicare Other | Admitting: Internal Medicine

## 2011-12-18 ENCOUNTER — Encounter: Payer: Self-pay | Admitting: Internal Medicine

## 2011-12-18 VITALS — BP 143/85 | HR 92 | Temp 97.9°F | Ht 64.0 in | Wt 210.4 lb

## 2011-12-18 DIAGNOSIS — F3160 Bipolar disorder, current episode mixed, unspecified: Secondary | ICD-10-CM

## 2011-12-18 DIAGNOSIS — M1990 Unspecified osteoarthritis, unspecified site: Secondary | ICD-10-CM

## 2011-12-18 DIAGNOSIS — I10 Essential (primary) hypertension: Secondary | ICD-10-CM

## 2011-12-18 DIAGNOSIS — J3489 Other specified disorders of nose and nasal sinuses: Secondary | ICD-10-CM

## 2011-12-18 MED ORDER — HYDROCHLOROTHIAZIDE 25 MG PO TABS: 25.0000 mg | ORAL_TABLET | Freq: Every day | ORAL | Status: DC

## 2011-12-18 MED ORDER — LORAZEPAM 0.5 MG PO TABS: 0.5000 mg | ORAL_TABLET | Freq: Every day | ORAL | Status: DC

## 2011-12-18 MED ORDER — GUAIFENESIN-CODEINE 100-10 MG/5ML PO SYRP: 5.0000 mL | ORAL_SOLUTION | Freq: Three times a day (TID) | ORAL | Status: DC | PRN

## 2011-12-18 MED ORDER — PAROXETINE HCL 30 MG PO TABS: 30.0000 mg | ORAL_TABLET | ORAL | Status: DC

## 2011-12-18 MED ORDER — TRAMADOL HCL 50 MG PO TABS: 50.0000 mg | ORAL_TABLET | Freq: Two times a day (BID) | ORAL | Status: DC | PRN
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
## 2011-12-18 NOTE — Patient Instructions (Signed)
1. Make an follow up appt with your psych doctor in 1-2 weeks.

## 2011-12-18 NOTE — Assessment & Plan Note (Signed)
She states that she has been taking her HCTZ and her BP is 143/85 today.  - I will give her one month supply for now.

## 2011-12-18 NOTE — Assessment & Plan Note (Signed)
The most recent Lumbar X ray done in 2011 did not indicate any acute or chronic findings. I have explained to patient that there is no evidence for chronic narcotic use. She becomes angry and augmentative, and the interview was interrupted.  - will  Give her two weeks supply of tramadol  - patient declined further care from our clinic.

## 2011-12-18 NOTE — Assessment & Plan Note (Addendum)
See HPI  The etiology is likely seasonal allergies. She does not have s/s infection now.  - patient declined any medical treatment for allergy - will prescribe her with Guaifenesin codeine syrup as needed. No Refills.

## 2011-12-18 NOTE — Assessment & Plan Note (Signed)
See HPI  - patient is encouraged to make follow up appt with her psych doctor with 1 week.

## 2012-01-29 ENCOUNTER — Other Ambulatory Visit: Payer: Self-pay | Admitting: Internal Medicine

## 2012-01-29 DIAGNOSIS — Z1231 Encounter for screening mammogram for malignant neoplasm of breast: Secondary | ICD-10-CM

## 2012-02-03 ENCOUNTER — Ambulatory Visit: Payer: Medicare Other

## 2012-02-10 ENCOUNTER — Other Ambulatory Visit: Payer: Self-pay | Admitting: Internal Medicine

## 2012-02-10 DIAGNOSIS — Z1231 Encounter for screening mammogram for malignant neoplasm of breast: Secondary | ICD-10-CM

## 2012-03-05 ENCOUNTER — Ambulatory Visit: Payer: Medicare Other

## 2012-03-08 ENCOUNTER — Ambulatory Visit: Payer: Medicare Other

## 2012-03-18 ENCOUNTER — Other Ambulatory Visit: Payer: Self-pay | Admitting: *Deleted

## 2012-03-18 ENCOUNTER — Ambulatory Visit
Admission: RE | Admit: 2012-03-18 | Discharge: 2012-03-18 | Disposition: A | Discharge status: Home / Self Care | Payer: Medicare Other | Source: Ambulatory Visit | Attending: Internal Medicine | Admitting: Internal Medicine

## 2012-03-18 DIAGNOSIS — Z803 Family history of malignant neoplasm of breast: Secondary | ICD-10-CM

## 2012-03-18 DIAGNOSIS — N6009 Solitary cyst of unspecified breast: Secondary | ICD-10-CM

## 2012-03-18 DIAGNOSIS — Z1231 Encounter for screening mammogram for malignant neoplasm of breast: Secondary | ICD-10-CM

## 2012-03-18 NOTE — Progress Notes (Signed)
Agree with the change. I think that the order was signed already.  Thank you.

## 2012-03-18 NOTE — Progress Notes (Signed)
Pt here to have screening mammogram changed to diagnostic mammo 2/2 hx of breast cancer in sister and given her hx of benign cysts on breast.  [facility] will not obtain without an order.  Order changed to diagnostic and order sent to [facility] for signature.Kingsley Spittle Cassady7/18/20133:43 PM

## 2012-04-01 ENCOUNTER — Other Ambulatory Visit: Payer: Self-pay | Admitting: *Deleted

## 2012-04-01 NOTE — Telephone Encounter (Signed)
At last visit with Dr. Dierdre Searles patient declined further care from our clinic.  Prescription therefore not refilled as we can not assure it remains an appropriate medication or follow-up BMets to assure there are not considerable side effects.

## 2012-04-07 ENCOUNTER — Telehealth: Payer: Self-pay | Admitting: *Deleted

## 2012-04-07 MED ORDER — HYDROCHLOROTHIAZIDE 25 MG PO TABS: 25.0000 mg | ORAL_TABLET | Freq: Every day | ORAL | Status: DC

## 2012-04-07 NOTE — Telephone Encounter (Signed)
Pt was called and stated she will call back to schedule an appt.

## 2012-04-07 NOTE — Telephone Encounter (Signed)
Pt states she is completely out of medication. And she denied stating declining any further care from the clinic.

## 2012-04-07 NOTE — Telephone Encounter (Signed)
It appears she was denied (appropriately) opioids and that was when she stated she was not coming back. Will refill 30 days but pt must make appt to cont to receive meds. I will eter FYI stating this.

## 2012-04-19 ENCOUNTER — Inpatient Hospital Stay: Admission: RE | Admit: 2012-04-19 | Payer: Medicare Other | Source: Ambulatory Visit

## 2012-06-04 ENCOUNTER — Ambulatory Visit
Admission: RE | Admit: 2012-06-04 | Discharge: 2012-06-04 | Disposition: A | Discharge status: Home / Self Care | Payer: Medicare Other | Source: Ambulatory Visit | Attending: Internal Medicine | Admitting: Internal Medicine

## 2012-06-04 DIAGNOSIS — N6009 Solitary cyst of unspecified breast: Secondary | ICD-10-CM

## 2012-06-04 DIAGNOSIS — Z803 Family history of malignant neoplasm of breast: Secondary | ICD-10-CM

## 2012-11-12 ENCOUNTER — Emergency Department (HOSPITAL_COMMUNITY): Payer: Medicare Other

## 2012-11-12 ENCOUNTER — Encounter (HOSPITAL_COMMUNITY): Payer: Self-pay | Admitting: *Deleted

## 2012-11-12 ENCOUNTER — Emergency Department (HOSPITAL_COMMUNITY)
Admission: EM | Admit: 2012-11-12 | Discharge: 2012-11-12 | Disposition: A | Discharge status: Home / Self Care | Payer: Medicare Other | Attending: Emergency Medicine | Admitting: Emergency Medicine

## 2012-11-12 DIAGNOSIS — E785 Hyperlipidemia, unspecified: Secondary | ICD-10-CM | POA: Insufficient documentation

## 2012-11-12 DIAGNOSIS — M25519 Pain in unspecified shoulder: Secondary | ICD-10-CM | POA: Insufficient documentation

## 2012-11-12 DIAGNOSIS — Z79899 Other long term (current) drug therapy: Secondary | ICD-10-CM | POA: Insufficient documentation

## 2012-11-12 DIAGNOSIS — R209 Unspecified disturbances of skin sensation: Secondary | ICD-10-CM | POA: Insufficient documentation

## 2012-11-12 DIAGNOSIS — M545 Low back pain, unspecified: Secondary | ICD-10-CM | POA: Insufficient documentation

## 2012-11-12 DIAGNOSIS — E669 Obesity, unspecified: Secondary | ICD-10-CM | POA: Insufficient documentation

## 2012-11-12 DIAGNOSIS — G8929 Other chronic pain: Secondary | ICD-10-CM | POA: Insufficient documentation

## 2012-11-12 DIAGNOSIS — I10 Essential (primary) hypertension: Secondary | ICD-10-CM | POA: Insufficient documentation

## 2012-11-12 DIAGNOSIS — Z87891 Personal history of nicotine dependence: Secondary | ICD-10-CM | POA: Insufficient documentation

## 2012-11-12 DIAGNOSIS — F411 Generalized anxiety disorder: Secondary | ICD-10-CM | POA: Insufficient documentation

## 2012-11-12 DIAGNOSIS — Z7982 Long term (current) use of aspirin: Secondary | ICD-10-CM | POA: Insufficient documentation

## 2012-11-12 DIAGNOSIS — F319 Bipolar disorder, unspecified: Secondary | ICD-10-CM | POA: Insufficient documentation

## 2012-11-12 DIAGNOSIS — M129 Arthropathy, unspecified: Secondary | ICD-10-CM | POA: Insufficient documentation

## 2012-11-12 MED ORDER — HYDROCODONE-ACETAMINOPHEN 5-325 MG PO TABS: 1.0000 | ORAL_TABLET | ORAL | Status: DC | PRN

## 2012-11-12 MED ORDER — OXYCODONE-ACETAMINOPHEN 5-325 MG PO TABS
1.0000 | ORAL_TABLET | Freq: Once | ORAL | Status: AC
  Administered 2012-11-12: 1 via ORAL
  Filled 2012-11-12: qty 1

## 2012-11-12 MED ORDER — CYCLOBENZAPRINE HCL 10 MG PO TABS: 10.0000 mg | ORAL_TABLET | Freq: Two times a day (BID) | ORAL | Status: DC | PRN

## 2012-11-12 NOTE — ED Provider Notes (Signed)
History     CSN: [id number]  Arrival date & time 11/12/12  1338   First MD Initiated Contact with Patient 11/12/12 1638      Chief Complaint  Patient presents with  . Back Pain  . Shoulder Pain    (Consider location/radiation/quality/duration/timing/severity/associated sxs/prior treatment) Patient is a 52 y.o. female presenting with back pain and shoulder pain. The history is provided by the patient.  Back Pain Location:  Lumbar spine Quality:  Aching Radiates to:  L shoulder, R shoulder and L thigh Pain severity:  Moderate (8/10) Pain is:  Same all the time Timing:  Constant Progression:  Worsening Chronicity:  Chronic (has had problems for years but worse x 2 weeks) Context: emotional stress   Context: not falling, not physical stress, not recent illness and not recent injury   Relieved by: icey hot. Worsened by:  Movement, standing, twisting and stress Associated symptoms: leg pain, numbness and tingling   Associated symptoms: no bladder incontinence, no bowel incontinence, no dysuria, no fever and no headaches   Risk factors: no hx of cancer, no recent surgery and no steroid use  Post-menopausal: hysterectomy.   Shoulder Pain Associated symptoms include numbness. Pertinent negatives include no chills, fever, headaches, nausea, neck pain or vomiting.  the patient states that she is worried that she may have bone cancer. She has been told she has DDD and DJD. Sometimes there is numbness in the left leg and she feels like she can't walk.  In addition to her back pain she has pain in her shoulder joints. She has been on Tramadol x 2 years for pain but it no longer helps.   Past Medical History  Diagnosis Date  . Hypertension   . OA (osteoarthritis)   . Bipolar disorder   . Dyslipidemia   . Obesity     History reviewed. No pertinent past surgical history.  No family history on file.  History  Substance Use Topics  . Smoking status: Former Smoker -- 0.50 packs/day    Types: Cigarettes  . Smokeless tobacco: Not on file  . Alcohol Use: Not on file    OB History   Grav Para Term Preterm Abortions TAB SAB Ect Mult Living                  Review of Systems  Constitutional: Negative for fever and chills.  HENT: Negative for neck pain and neck stiffness.   Eyes: Negative for pain.  Respiratory: Negative for wheezing.   Cardiovascular: Negative for leg swelling.  Gastrointestinal: Negative for nausea, vomiting and bowel incontinence.  Genitourinary: Negative for bladder incontinence and dysuria.  Musculoskeletal: Positive for back pain.  Skin: Negative for wound.  Allergic/Immunologic: Negative for environmental allergies and food allergies.  Neurological: Positive for tingling and numbness. Negative for headaches.  Psychiatric/Behavioral: Negative for confusion. The patient is nervous/anxious.     Allergies  Review of patient's allergies indicates no known allergies.  Home Medications   Current Outpatient Rx  Name  Route  Sig  Dispense  Refill  . aspirin-acetaminophen-caffeine (EXCEDRIN EXTRA STRENGTH) 250-250-65 MG per tablet   Oral   Take 1-2 tablets by mouth daily as needed for pain.         Marland Kitchen LORazepam (ATIVAN) 0.5 MG tablet   Oral   Take 1 tablet (0.5 mg total) by mouth daily.   20 tablet   0   . PARoxetine (PAXIL) 30 MG tablet   Oral   Take 30 mg by mouth  every morning.         . traMADol (ULTRAM) 50 MG tablet   Oral   Take 1 tablet (50 mg total) by mouth 2 (two) times daily as needed for pain.   14 tablet   0   . triamterene-hydrochlorothiazide (MAXZIDE-25) 37.5-25 MG per tablet   Oral   Take 1 tablet by mouth at bedtime.           BP 133/91  Pulse 88  Temp(Src) 98 F (36.7 C) (Oral)  Resp 18  SpO2 99%  Physical Exam  Nursing note and vitals reviewed. Constitutional: She is oriented to person, place, and time. She appears well-developed and well-nourished.  HENT:  Head: Normocephalic and atraumatic.    Eyes: EOM are normal. Pupils are equal, round, and reactive to light.  Neck: Neck supple.  Cardiovascular: Normal rate and regular rhythm.   Pulmonary/Chest: Effort normal and breath sounds normal. No respiratory distress.  Abdominal: Soft. Bowel sounds are normal. There is no tenderness.  Musculoskeletal: She exhibits no edema.       Lumbar back: She exhibits decreased range of motion, bony tenderness and spasm.       Back:  Pedal pulses presents, adequate circulation. Good touch sensation.  Neurological: She is alert and oriented to person, place, and time. She has normal strength and normal reflexes. No cranial nerve deficit or sensory deficit. Gait normal.  Skin: Skin is warm and dry.  Psychiatric: Her speech is normal and behavior is normal. Judgment and thought content normal. Her mood appears anxious. Cognition and memory are normal.  Patient is anxious and continues to focus on what her symptoms may be signs of . She is afraid of having Parkinson disease, ruptured disc or bone cancer. She knows people who have these and think she may also.     ED Course  Procedures (including critical care time) Ct Lumbar Spine Wo Contrast  11/12/2012  *RADIOLOGY REPORT*  Clinical Data: Severe back pain with left leg pain.  CT LUMBAR SPINE WITHOUT CONTRAST  Technique:  Multidetector CT imaging of the lumbar spine was performed without intravenous contrast administration. Multiplanar CT image reconstructions were also generated.  Comparison: Lumbar radiographs dated 07/28/2010  Findings: T12-L1 through L3-4:  Normal.  L4-5:  Slight diffuse bulge of the disc with no neural impingement. Moderate left facet arthritis.  L5-S1:  There is slight increased density in the left lateral recess which could represent a small disc protrusion which could affect the left L5 or S1 nerve roots.  IMPRESSION:  1. Possible small focal disc protrusion at L5-S1 on the left.  This is not definitive. 2.  Moderate facet arthritis  at L4-5 on the left.   Original Report Authenticated By: Francene Boyers, M.D.     Assessment: Low back pain   Shoulder pain   Arthritis  Plan:  Pain management   Follow up with PCP or Dr.[place]oonqu[place]   Return as needed. MDM  52 y.o. female with low back pain, leg pain and shoulder pain. After evaluation and review of CT scan results, doubt patients concerns regarding bone cancer, Parkinson or MS. Symptoms most likely related to disc protrusion and arthritis. Discussed findings with the patient and importance of follow up with Dr. Gerilyn Pilgrim. Patient voices understanding.    Medication List    TAKE these medications       cyclobenzaprine 10 MG tablet  Commonly known as:  FLEXERIL  Take 1 tablet (10 mg total) by mouth 2 (two) times daily  as needed for muscle spasms.     HYDROcodone-acetaminophen 5-325 MG per tablet  Commonly known as:  NORCO/VICODIN  Take 1 tablet by mouth every 4 (four) hours as needed for pain.      ASK your doctor about these medications       EXCEDRIN EXTRA STRENGTH 250-250-65 MG per tablet  Generic drug:  aspirin-acetaminophen-caffeine  Take 1-2 tablets by mouth daily as needed for pain.     LORazepam 0.5 MG tablet  Commonly known as:  ATIVAN  Take 1 tablet (0.5 mg total) by mouth daily.     PARoxetine 30 MG tablet  Commonly known as:  PAXIL  Take 30 mg by mouth every morning.     traMADol 50 MG tablet  Commonly known as:  ULTRAM  Take 1 tablet (50 mg total) by mouth 2 (two) times daily as needed for pain.     triamterene-hydrochlorothiazide 37.5-25 MG per tablet  Commonly known as:  MAXZIDE-25  Take 1 tablet by mouth at bedtime.              [facility] Orlene Och, [place] 11/12/12 [phone]

## 2012-11-12 NOTE — ED Notes (Signed)
Requesting med for pain

## 2012-11-12 NOTE — ED Notes (Signed)
Pt states worsening back pain x 2 weeks. Hx of DDD. PT also states stiffening of joints, mainly to bilat shoulders. NAD.

## 2012-11-12 NOTE — ED Notes (Signed)
Says she feels better 

## 2012-11-12 NOTE — ED Notes (Signed)
Low back pain for 2 weeks, hx of back pain with DDD.  No recent injury known.

## 2012-11-13 NOTE — ED Provider Notes (Signed)
Medical screening examination/treatment/procedure(s) were performed by non-physician practitioner and as supervising physician I was immediately available for consultation/collaboration.   Hurman Horn, MD 11/13/12 [phone]

## 2013-04-08 ENCOUNTER — Other Ambulatory Visit: Payer: Self-pay

## 2013-04-08 DIAGNOSIS — Z1231 Encounter for screening mammogram for malignant neoplasm of breast: Secondary | ICD-10-CM

## 2013-04-26 ENCOUNTER — Ambulatory Visit: Payer: Medicare Other

## 2013-05-03 ENCOUNTER — Ambulatory Visit
Admission: RE | Admit: 2013-05-03 | Discharge: 2013-05-03 | Disposition: A | Discharge status: Home / Self Care | Payer: Medicare Other | Source: Ambulatory Visit

## 2013-05-03 DIAGNOSIS — Z1231 Encounter for screening mammogram for malignant neoplasm of breast: Secondary | ICD-10-CM

## 2013-05-31 ENCOUNTER — Emergency Department (HOSPITAL_COMMUNITY)
Admission: EM | Admit: 2013-05-31 | Discharge: 2013-05-31 | Disposition: A | Discharge status: Home / Self Care | Payer: Medicare Other | Attending: Emergency Medicine | Admitting: Emergency Medicine

## 2013-05-31 ENCOUNTER — Encounter (HOSPITAL_COMMUNITY): Payer: Self-pay | Admitting: Emergency Medicine

## 2013-05-31 ENCOUNTER — Emergency Department (HOSPITAL_COMMUNITY): Payer: Medicare Other

## 2013-05-31 DIAGNOSIS — S335XXA Sprain of ligaments of lumbar spine, initial encounter: Secondary | ICD-10-CM | POA: Insufficient documentation

## 2013-05-31 DIAGNOSIS — Z79899 Other long term (current) drug therapy: Secondary | ICD-10-CM | POA: Insufficient documentation

## 2013-05-31 DIAGNOSIS — I10 Essential (primary) hypertension: Secondary | ICD-10-CM | POA: Insufficient documentation

## 2013-05-31 DIAGNOSIS — H53149 Visual discomfort, unspecified: Secondary | ICD-10-CM | POA: Insufficient documentation

## 2013-05-31 DIAGNOSIS — R6883 Chills (without fever): Secondary | ICD-10-CM | POA: Insufficient documentation

## 2013-05-31 DIAGNOSIS — S139XXA Sprain of joints and ligaments of unspecified parts of neck, initial encounter: Secondary | ICD-10-CM | POA: Insufficient documentation

## 2013-05-31 DIAGNOSIS — Z8639 Personal history of other endocrine, nutritional and metabolic disease: Secondary | ICD-10-CM | POA: Insufficient documentation

## 2013-05-31 DIAGNOSIS — S46909A Unspecified injury of unspecified muscle, fascia and tendon at shoulder and upper arm level, unspecified arm, initial encounter: Secondary | ICD-10-CM | POA: Insufficient documentation

## 2013-05-31 DIAGNOSIS — S298XXA Other specified injuries of thorax, initial encounter: Secondary | ICD-10-CM | POA: Insufficient documentation

## 2013-05-31 DIAGNOSIS — Z862 Personal history of diseases of the blood and blood-forming organs and certain disorders involving the immune mechanism: Secondary | ICD-10-CM | POA: Insufficient documentation

## 2013-05-31 DIAGNOSIS — Z87891 Personal history of nicotine dependence: Secondary | ICD-10-CM | POA: Insufficient documentation

## 2013-05-31 DIAGNOSIS — Y9389 Activity, other specified: Secondary | ICD-10-CM | POA: Insufficient documentation

## 2013-05-31 DIAGNOSIS — R079 Chest pain, unspecified: Secondary | ICD-10-CM

## 2013-05-31 DIAGNOSIS — Y92410 Unspecified street and highway as the place of occurrence of the external cause: Secondary | ICD-10-CM | POA: Insufficient documentation

## 2013-05-31 DIAGNOSIS — S161XXA Strain of muscle, fascia and tendon at neck level, initial encounter: Secondary | ICD-10-CM

## 2013-05-31 DIAGNOSIS — S0993XA Unspecified injury of face, initial encounter: Secondary | ICD-10-CM | POA: Insufficient documentation

## 2013-05-31 DIAGNOSIS — F319 Bipolar disorder, unspecified: Secondary | ICD-10-CM | POA: Insufficient documentation

## 2013-05-31 DIAGNOSIS — S39012A Strain of muscle, fascia and tendon of lower back, initial encounter: Secondary | ICD-10-CM

## 2013-05-31 DIAGNOSIS — S4980XA Other specified injuries of shoulder and upper arm, unspecified arm, initial encounter: Secondary | ICD-10-CM | POA: Insufficient documentation

## 2013-05-31 DIAGNOSIS — E669 Obesity, unspecified: Secondary | ICD-10-CM | POA: Insufficient documentation

## 2013-05-31 DIAGNOSIS — Z8739 Personal history of other diseases of the musculoskeletal system and connective tissue: Secondary | ICD-10-CM | POA: Insufficient documentation

## 2013-05-31 MED ORDER — HYDROCODONE-ACETAMINOPHEN 5-325 MG PO TABS
1.0000 | ORAL_TABLET | Freq: Once | ORAL | Status: AC
  Administered 2013-05-31: 1 via ORAL
  Filled 2013-05-31: qty 1

## 2013-05-31 MED ORDER — HYDROCODONE-ACETAMINOPHEN 5-325 MG PO TABS: 1.0000 | ORAL_TABLET | Freq: Four times a day (QID) | ORAL | Status: DC | PRN

## 2013-05-31 MED ORDER — TRAMADOL HCL 50 MG PO TABS: 50.0000 mg | ORAL_TABLET | Freq: Four times a day (QID) | ORAL | Status: DC | PRN
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
## 2013-05-31 NOTE — ED Notes (Signed)
Discharge instructions given and reviewed with patient.  Prescription given for Vicodin; effects and use explained.  Patient verbalized understanding of sedating effects of medication and to follow up with PMD as needed.  Patient ambulatory; discharged home in good condition.
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
## 2013-06-02 ENCOUNTER — Emergency Department (HOSPITAL_COMMUNITY): Payer: Medicare Other

## 2013-06-02 ENCOUNTER — Emergency Department (HOSPITAL_COMMUNITY)
Admission: EM | Admit: 2013-06-02 | Discharge: 2013-06-02 | Disposition: A | Discharge status: Home / Self Care | Payer: Medicare Other | Attending: Emergency Medicine | Admitting: Emergency Medicine

## 2013-06-02 ENCOUNTER — Encounter (HOSPITAL_COMMUNITY): Payer: Self-pay | Admitting: Emergency Medicine

## 2013-06-02 DIAGNOSIS — M549 Dorsalgia, unspecified: Secondary | ICD-10-CM

## 2013-06-02 DIAGNOSIS — M545 Low back pain, unspecified: Secondary | ICD-10-CM | POA: Insufficient documentation

## 2013-06-02 DIAGNOSIS — Z87891 Personal history of nicotine dependence: Secondary | ICD-10-CM | POA: Insufficient documentation

## 2013-06-02 DIAGNOSIS — M1990 Unspecified osteoarthritis, unspecified site: Secondary | ICD-10-CM | POA: Insufficient documentation

## 2013-06-02 DIAGNOSIS — I10 Essential (primary) hypertension: Secondary | ICD-10-CM | POA: Insufficient documentation

## 2013-06-02 DIAGNOSIS — Z79899 Other long term (current) drug therapy: Secondary | ICD-10-CM | POA: Insufficient documentation

## 2013-06-02 DIAGNOSIS — Z8659 Personal history of other mental and behavioral disorders: Secondary | ICD-10-CM | POA: Insufficient documentation

## 2013-06-02 DIAGNOSIS — G8911 Acute pain due to trauma: Secondary | ICD-10-CM | POA: Insufficient documentation

## 2013-06-02 DIAGNOSIS — E669 Obesity, unspecified: Secondary | ICD-10-CM | POA: Insufficient documentation

## 2013-06-02 DIAGNOSIS — M542 Cervicalgia: Secondary | ICD-10-CM | POA: Insufficient documentation

## 2013-06-02 DIAGNOSIS — Z90710 Acquired absence of both cervix and uterus: Secondary | ICD-10-CM | POA: Insufficient documentation

## 2013-06-02 DIAGNOSIS — R109 Unspecified abdominal pain: Secondary | ICD-10-CM

## 2013-06-02 DIAGNOSIS — Z791 Long term (current) use of non-steroidal anti-inflammatories (NSAID): Secondary | ICD-10-CM | POA: Insufficient documentation

## 2013-06-02 DIAGNOSIS — E860 Dehydration: Secondary | ICD-10-CM | POA: Insufficient documentation

## 2013-06-02 LAB — URINALYSIS, ROUTINE W REFLEX MICROSCOPIC
Glucose, UA: NEGATIVE mg/dL
Ketones, ur: NEGATIVE mg/dL
Leukocytes, UA: NEGATIVE
Nitrite: NEGATIVE
Protein, ur: NEGATIVE mg/dL
pH: 6 (ref 5.0–8.0)

## 2013-06-02 MED ORDER — NAPROXEN 500 MG PO TABS: 500.0000 mg | ORAL_TABLET | Freq: Two times a day (BID) | ORAL | Status: DC

## 2013-06-02 MED ORDER — HYDROCODONE-ACETAMINOPHEN 5-325 MG PO TABS
2.0000 | ORAL_TABLET | Freq: Once | ORAL | Status: AC
  Administered 2013-06-02: 2 via ORAL
  Filled 2013-06-02: qty 2
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
## 2014-03-07 ENCOUNTER — Emergency Department (HOSPITAL_COMMUNITY)
Admission: EM | Admit: 2014-03-07 | Discharge: 2014-03-07 | Disposition: A | Discharge status: Home / Self Care | Payer: Medicare Other | Attending: Emergency Medicine | Admitting: Emergency Medicine

## 2014-03-07 ENCOUNTER — Encounter (HOSPITAL_COMMUNITY): Payer: Self-pay | Admitting: Emergency Medicine

## 2014-03-07 ENCOUNTER — Emergency Department (HOSPITAL_COMMUNITY): Payer: Medicare Other

## 2014-03-07 DIAGNOSIS — T148XXA Other injury of unspecified body region, initial encounter: Secondary | ICD-10-CM | POA: Insufficient documentation

## 2014-03-07 DIAGNOSIS — S8990XA Unspecified injury of unspecified lower leg, initial encounter: Secondary | ICD-10-CM | POA: Insufficient documentation

## 2014-03-07 DIAGNOSIS — E669 Obesity, unspecified: Secondary | ICD-10-CM | POA: Diagnosis not present

## 2014-03-07 DIAGNOSIS — S79929A Unspecified injury of unspecified thigh, initial encounter: Secondary | ICD-10-CM | POA: Diagnosis not present

## 2014-03-07 DIAGNOSIS — S99919A Unspecified injury of unspecified ankle, initial encounter: Secondary | ICD-10-CM | POA: Diagnosis not present

## 2014-03-07 DIAGNOSIS — Y92410 Unspecified street and highway as the place of occurrence of the external cause: Secondary | ICD-10-CM | POA: Diagnosis not present

## 2014-03-07 DIAGNOSIS — S199XXA Unspecified injury of neck, initial encounter: Secondary | ICD-10-CM

## 2014-03-07 DIAGNOSIS — S79919A Unspecified injury of unspecified hip, initial encounter: Secondary | ICD-10-CM | POA: Diagnosis not present

## 2014-03-07 DIAGNOSIS — S6990XA Unspecified injury of unspecified wrist, hand and finger(s), initial encounter: Secondary | ICD-10-CM | POA: Insufficient documentation

## 2014-03-07 DIAGNOSIS — Z79899 Other long term (current) drug therapy: Secondary | ICD-10-CM | POA: Insufficient documentation

## 2014-03-07 DIAGNOSIS — S99929A Unspecified injury of unspecified foot, initial encounter: Secondary | ICD-10-CM

## 2014-03-07 DIAGNOSIS — S59909A Unspecified injury of unspecified elbow, initial encounter: Secondary | ICD-10-CM | POA: Diagnosis present

## 2014-03-07 DIAGNOSIS — M1990 Unspecified osteoarthritis, unspecified site: Secondary | ICD-10-CM | POA: Diagnosis not present

## 2014-03-07 DIAGNOSIS — S0993XA Unspecified injury of face, initial encounter: Secondary | ICD-10-CM | POA: Insufficient documentation

## 2014-03-07 DIAGNOSIS — T07XXXA Unspecified multiple injuries, initial encounter: Secondary | ICD-10-CM | POA: Diagnosis not present

## 2014-03-07 DIAGNOSIS — F319 Bipolar disorder, unspecified: Secondary | ICD-10-CM | POA: Insufficient documentation

## 2014-03-07 DIAGNOSIS — Z791 Long term (current) use of non-steroidal anti-inflammatories (NSAID): Secondary | ICD-10-CM | POA: Diagnosis not present

## 2014-03-07 DIAGNOSIS — I10 Essential (primary) hypertension: Secondary | ICD-10-CM | POA: Insufficient documentation

## 2014-03-07 DIAGNOSIS — Y9389 Activity, other specified: Secondary | ICD-10-CM | POA: Insufficient documentation

## 2014-03-07 DIAGNOSIS — F17200 Nicotine dependence, unspecified, uncomplicated: Secondary | ICD-10-CM | POA: Diagnosis not present

## 2014-03-07 MED ORDER — CYCLOBENZAPRINE HCL 10 MG PO TABS: 10.0000 mg | ORAL_TABLET | Freq: Two times a day (BID) | ORAL | Status: DC | PRN

## 2014-03-07 MED ORDER — IBUPROFEN 400 MG PO TABS
600.0000 mg | ORAL_TABLET | Freq: Once | ORAL | Status: AC
  Administered 2014-03-07: 600 mg via ORAL
  Filled 2014-03-07: qty 2

## 2014-03-07 MED ORDER — HYDROCODONE-ACETAMINOPHEN 5-325 MG PO TABS: 1.0000 | ORAL_TABLET | ORAL | Status: DC | PRN
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
## 2014-03-07 NOTE — Discharge Instructions (Signed)
Take the medication as directed. Do not take if you are driving because they will make you sleepy. Return as needed.

## 2014-03-09 NOTE — ED Provider Notes (Signed)
Medical screening examination/treatment/procedure(s) were performed by non-physician practitioner and as supervising physician I was immediately available for consultation/collaboration.   EKG Interpretation None       Donnetta HutchingBrian Tage Feggins, MD 03/09/14 [phone]

## 2014-05-25 ENCOUNTER — Encounter (HOSPITAL_COMMUNITY): Payer: Self-pay | Admitting: Emergency Medicine

## 2014-05-25 ENCOUNTER — Emergency Department (HOSPITAL_COMMUNITY): Payer: Medicare Other

## 2014-05-25 ENCOUNTER — Emergency Department (HOSPITAL_COMMUNITY)
Admission: EM | Admit: 2014-05-25 | Discharge: 2014-05-25 | Disposition: A | Discharge status: Home / Self Care | Payer: Medicare Other | Attending: Emergency Medicine | Admitting: Emergency Medicine

## 2014-05-25 DIAGNOSIS — M545 Low back pain, unspecified: Secondary | ICD-10-CM | POA: Insufficient documentation

## 2014-05-25 DIAGNOSIS — R0989 Other specified symptoms and signs involving the circulatory and respiratory systems: Secondary | ICD-10-CM | POA: Insufficient documentation

## 2014-05-25 DIAGNOSIS — Z90710 Acquired absence of both cervix and uterus: Secondary | ICD-10-CM | POA: Insufficient documentation

## 2014-05-25 DIAGNOSIS — R350 Frequency of micturition: Secondary | ICD-10-CM | POA: Insufficient documentation

## 2014-05-25 DIAGNOSIS — M549 Dorsalgia, unspecified: Secondary | ICD-10-CM

## 2014-05-25 DIAGNOSIS — R509 Fever, unspecified: Secondary | ICD-10-CM | POA: Insufficient documentation

## 2014-05-25 DIAGNOSIS — M1990 Unspecified osteoarthritis, unspecified site: Secondary | ICD-10-CM | POA: Diagnosis not present

## 2014-05-25 DIAGNOSIS — E785 Hyperlipidemia, unspecified: Secondary | ICD-10-CM | POA: Diagnosis not present

## 2014-05-25 DIAGNOSIS — F17200 Nicotine dependence, unspecified, uncomplicated: Secondary | ICD-10-CM | POA: Diagnosis not present

## 2014-05-25 DIAGNOSIS — R32 Unspecified urinary incontinence: Secondary | ICD-10-CM | POA: Diagnosis not present

## 2014-05-25 DIAGNOSIS — F319 Bipolar disorder, unspecified: Secondary | ICD-10-CM | POA: Diagnosis not present

## 2014-05-25 DIAGNOSIS — R059 Cough, unspecified: Secondary | ICD-10-CM | POA: Insufficient documentation

## 2014-05-25 DIAGNOSIS — E669 Obesity, unspecified: Secondary | ICD-10-CM | POA: Diagnosis not present

## 2014-05-25 DIAGNOSIS — R109 Unspecified abdominal pain: Secondary | ICD-10-CM | POA: Diagnosis not present

## 2014-05-25 DIAGNOSIS — R05 Cough: Secondary | ICD-10-CM | POA: Insufficient documentation

## 2014-05-25 DIAGNOSIS — I10 Essential (primary) hypertension: Secondary | ICD-10-CM | POA: Diagnosis not present

## 2014-05-25 DIAGNOSIS — R0602 Shortness of breath: Secondary | ICD-10-CM | POA: Diagnosis not present

## 2014-05-25 DIAGNOSIS — G8929 Other chronic pain: Secondary | ICD-10-CM | POA: Insufficient documentation

## 2014-05-25 DIAGNOSIS — R058 Other specified cough: Secondary | ICD-10-CM

## 2014-05-25 LAB — BASIC METABOLIC PANEL
Anion gap: 11 (ref 5–15)
BUN: 6 mg/dL (ref 6–23)
CALCIUM: 9.2 mg/dL (ref 8.4–10.5)
CO2: 27 meq/L (ref 19–32)
CREATININE: 0.81 mg/dL (ref 0.50–1.10)
Chloride: 101 mEq/L (ref 96–112)
GFR calc Af Amer: >90 mL/min (ref >90)
GFR calc non Af Amer: 81 mL/min — ABNORMAL LOW (ref >90)
GLUCOSE: 93 mg/dL (ref 70–99)
Potassium: 3.7 mEq/L (ref 3.7–5.3)
Sodium: 139 mEq/L (ref 137–147)

## 2014-05-25 LAB — URINALYSIS, ROUTINE W REFLEX MICROSCOPIC
Bilirubin Urine: NEGATIVE
GLUCOSE, UA: NEGATIVE mg/dL
HGB URINE DIPSTICK: NEGATIVE
Ketones, ur: NEGATIVE mg/dL
Leukocytes, UA: NEGATIVE
Nitrite: NEGATIVE
PROTEIN: NEGATIVE mg/dL
SPECIFIC GRAVITY, URINE: 1.015 (ref 1.005–1.030)
Urobilinogen, UA: 0.2 mg/dL (ref 0.0–1.0)
pH: 7 (ref 5.0–8.0)

## 2014-05-25 LAB — CBC WITH DIFFERENTIAL/PLATELET
Basophils Absolute: 0 10*3/uL (ref 0.0–0.1)
Basophils Relative: 1 % (ref 0–1)
EOS PCT: 3 % (ref 0–5)
Eosinophils Absolute: 0.1 10*3/uL (ref 0.0–0.7)
HEMATOCRIT: 38.6 % (ref 36.0–46.0)
Hemoglobin: 12.9 g/dL (ref 12.0–15.0)
LYMPHS ABS: 1.9 10*3/uL (ref 0.7–4.0)
LYMPHS PCT: 44 % (ref 12–46)
MCH: 28.7 pg (ref 26.0–34.0)
MCHC: 33.4 g/dL (ref 30.0–36.0)
MCV: 85.8 fL (ref 78.0–100.0)
MONO ABS: 0.4 10*3/uL (ref 0.1–1.0)
MONOS PCT: 8 % (ref 3–12)
Neutro Abs: 1.9 10*3/uL (ref 1.7–7.7)
Neutrophils Relative %: 44 % (ref 43–77)
Platelets: 301 10*3/uL (ref 150–400)
RBC: 4.5 MIL/uL (ref 3.87–5.11)
RDW: 13.4 % (ref 11.5–15.5)
WBC: 4.3 10*3/uL (ref 4.0–10.5)

## 2014-05-25 MED ORDER — AMLODIPINE BESYLATE 10 MG PO TABS
10.0000 mg | ORAL_TABLET | Freq: Every day | ORAL | Status: AC
Start: 2014-05-25 — End: ?

## 2014-05-25 MED ORDER — DOXYCYCLINE HYCLATE 100 MG PO CAPS: 100.0000 mg | ORAL_CAPSULE | Freq: Two times a day (BID) | ORAL | Status: DC

## 2014-05-25 MED ORDER — HYDROCODONE-ACETAMINOPHEN 7.5-325 MG/15ML PO SOLN: 15.0000 mL | Freq: Three times a day (TID) | ORAL | Status: DC | PRN

## 2014-05-25 MED ORDER — DOXYCYCLINE HYCLATE 100 MG PO TABS
100.0000 mg | ORAL_TABLET | Freq: Once | ORAL | Status: AC
  Administered 2014-05-25: 100 mg via ORAL
  Filled 2014-05-25: qty 1

## 2014-05-25 MED ORDER — LORAZEPAM 1 MG PO TABS
0.5000 mg | ORAL_TABLET | Freq: Once | ORAL | Status: AC
  Administered 2014-05-25: 0.5 mg via ORAL
  Filled 2014-05-25: qty 1

## 2014-05-25 MED ORDER — LISINOPRIL-HYDROCHLOROTHIAZIDE 10-12.5 MG PO TABS: 1.0000 | ORAL_TABLET | Freq: Every day | ORAL | Status: DC

## 2014-05-25 MED ORDER — SODIUM CHLORIDE 0.9 % IV BOLUS (SEPSIS)
1000.0000 mL | Freq: Once | INTRAVENOUS | Status: AC
  Administered 2014-05-25: 1000 mL via INTRAVENOUS

## 2014-05-25 MED ORDER — PREDNISONE 50 MG PO TABS
ORAL_TABLET | ORAL | Status: DC
Start: 2014-05-25 — End: 2015-06-06

## 2014-05-25 MED ORDER — CYCLOBENZAPRINE HCL 10 MG PO TABS: 10.0000 mg | ORAL_TABLET | Freq: Two times a day (BID) | ORAL | Status: DC | PRN

## 2014-05-25 MED ORDER — IPRATROPIUM-ALBUTEROL 0.5-2.5 (3) MG/3ML IN SOLN
3.0000 mL | Freq: Once | RESPIRATORY_TRACT | Status: AC
  Administered 2014-05-25: 3 mL via RESPIRATORY_TRACT
  Filled 2014-05-25: qty 3

## 2014-05-25 MED ORDER — ALBUTEROL SULFATE HFA 108 (90 BASE) MCG/ACT IN AERS
2.0000 | INHALATION_SPRAY | RESPIRATORY_TRACT | Status: AC | PRN
Start: 2014-05-25 — End: ?

## 2014-05-25 MED ORDER — METHYLPREDNISOLONE SODIUM SUCC 125 MG IJ SOLR
125.0000 mg | Freq: Once | INTRAMUSCULAR | Status: AC
  Administered 2014-05-25: 125 mg via INTRAVENOUS
  Filled 2014-05-25: qty 2

## 2014-05-25 MED ORDER — HYDROCOD POLST-CHLORPHEN POLST 10-8 MG/5ML PO LQCR
5.0000 mL | Freq: Once | ORAL | Status: AC
  Administered 2014-05-25: 5 mL via ORAL
  Filled 2014-05-25: qty 5

## 2014-05-25 NOTE — ED Provider Notes (Signed)
CSN: [id number]     Arrival date & time 05/25/14  0900 History   First MD Initiated Contact with Patient 05/25/14 [phone]     Chief Complaint  Patient presents with  . Cough  . Urinary Frequency  . Flank Pain     (Consider location/radiation/quality/duration/timing/severity/associated sxs/prior Treatment) HPI  Vicki Mayo is a 53 y.o. female complaining of productive cough with yellowish opaque sputum worsening over the course of 3 weeks. Patient said this is exacerbating her chronic low back pain. Patient also states that she has urinary incontinence when she coughs. Patient denies urinary frequency, dysuria, hematuria. Associated symptoms of subjective fever, chest congestion described as slight tightness and shortness of breath when she coughs. Patient is smoker but states that she's not smoking every day. She has sick contacts in her family members who she was visiting several weeks ago. They have cough cough and also nausea and vomiting. Patient has reduced by mouth intake because she is afraid that she will start vomiting similar to her sick contacts. Patient denies abdominal pain, change in bowel habits, chills or rigors.    Past Medical History  Diagnosis Date  . Hypertension   . OA (osteoarthritis)   . Bipolar disorder   . Dyslipidemia   . Obesity    Past Surgical History  Procedure Laterality Date  . Abdominal hysterectomy     No family history on file. History  Substance Use Topics  . Smoking status: Light Tobacco Smoker -- 0.25 packs/day    Types: Cigarettes  . Smokeless tobacco: Not on file  . Alcohol Use: No   OB History   Grav Para Term Preterm Abortions TAB SAB Ect Mult Living                 Review of Systems  10 systems reviewed and found to be negative, except as noted in the HPI.   Allergies  Review of patient's allergies indicates no known allergies.  Home Medications   Prior to Admission medications   Medication Sig Start Date End Date  Taking? Authorizing Provider  albuterol (PROVENTIL HFA;VENTOLIN HFA) 108 (90 BASE) MCG/ACT inhaler Inhale 2 puffs into the lungs every 6 (six) hours as needed for wheezing or shortness of breath.   Yes Historical Provider, MD  amLODipine (NORVASC) 10 MG tablet Take 10 mg by mouth daily.   Yes Historical Provider, MD  guaifenesin (ROBITUSSIN) 100 MG/5ML syrup Take 200 mg by mouth 3 (three) times daily as needed for cough or congestion.   Yes Historical Provider, MD  HYDROcodone-acetaminophen (NORCO/VICODIN) 5-325 MG per tablet Take 1-2 tablets by mouth every 6 (six) hours as needed for pain. 05/31/13  Yes Vanetta Mulders, MD  ibuprofen (ADVIL,MOTRIN) 200 MG tablet Take 200 mg by mouth every 6 (six) hours as needed for moderate pain.   Yes Historical Provider, MD  lisinopril-hydrochlorothiazide (PRINZIDE,ZESTORETIC) 10-12.5 MG per tablet Take 1 tablet by mouth daily. 01/03/14  Yes Historical Provider, MD  LORazepam (ATIVAN) 0.5 MG tablet Take 0.5 mg by mouth 2 (two) times daily as needed for anxiety.   Yes Historical Provider, MD  PARoxetine (PAXIL) 30 MG tablet Take 30 mg by mouth daily.   Yes Historical Provider, MD  Phenyleph-CPM-DM-Aspirin (ALKA-SELTZER PLUS COLD & COUGH PO) Take 1 tablet by mouth at bedtime.   Yes Historical Provider, MD  albuterol (PROVENTIL HFA;VENTOLIN HFA) 108 (90 BASE) MCG/ACT inhaler Inhale 2 puffs into the lungs every 4 (four) hours as needed for wheezing or shortness of breath. 05/25/14  Chrystie Hagwood, PA-C  amLODipine (NORVASC) 10 MG tablet Take 1 tablet (10 mg total) by mouth daily. 05/25/14   Ruth Kovich, PA-C  cyclobenzaprine (FLEXERIL) 10 MG tablet Take 1 tablet (10 mg total) by mouth 2 (two) times daily as needed for muscle spasms. 05/25/14   Clotiel Troop, PA-C  doxycycline (VIBRAMYCIN) 100 MG capsule Take 1 capsule (100 mg total) by mouth 2 (two) times daily. 05/25/14   Maddox Hlavaty, PA-C  HYDROcodone-acetaminophen (HYCET) 7.5-325 mg/15 ml solution Take 15  mLs by mouth every 8 (eight) hours as needed for moderate pain. 05/25/14   Brelan Hannen, PA-C  lisinopril-hydrochlorothiazide (PRINZIDE) 10-12.5 MG per tablet Take 1 tablet by mouth daily. 05/25/14   Jimi Schappert, PA-C  predniSONE (DELTASONE) 50 MG tablet Take 1 tablet daily with breakfast 05/25/14   Eriq Hufford, PA-C   BP 161/95  Pulse 96  Temp(Src) 98 F (36.7 C) (Oral)  Resp 20  SpO2 95% Physical Exam  ED Course  Procedures (including critical care time) Labs Review Labs Reviewed  BASIC METABOLIC PANEL - Abnormal; Notable for the following:    GFR calc non Af Amer 81 (*)    All other components within normal limits  URINALYSIS, ROUTINE W REFLEX MICROSCOPIC  CBC WITH DIFFERENTIAL    Imaging Review Dg Chest 2 View  05/25/2014   CLINICAL DATA:  Cough.  Shortness of breath.  EXAM: CHEST  2 VIEW  COMPARISON:  12/07/2013 .  FINDINGS: Mediastinum and hilar structures are normal the lungs are clear. Heart size normal. No pleural effusion or pneumothorax. No acute bony abnormality.  IMPRESSION: No active cardiopulmonary disease.   Electronically Signed   By: Maisie Fus  Register   On: 05/25/2014 10:16     EKG Interpretation   Date/Time:  Thursday May 25 2014 11:33:36 EDT Ventricular Rate:  85 PR Interval:  170 QRS Duration: 89 QT Interval:  448 QTC Calculation: 533 R Axis:   -7 Text Interpretation:  Sinus rhythm Probable anteroseptal infarct, old  Prolonged QT interval compared to prior, QTc longer Confirmed by [facility]   MD, TREY ([id number]) on 05/25/2014 12:35:13 PM      MDM   Final diagnoses:  Cough with sputum  Tobacco use disorder  Chronic back pain    Filed Vitals:   05/25/14 1130 05/25/14 1134 05/25/14 1228 05/25/14 1309  BP: 166/96 166/96 161/95   Pulse: 85 81 96   Temp:    98 F (36.7 C)  TempSrc:    Oral  Resp: SpO2: 98% 96% 95%     Medications  sodium chloride 0.9 % bolus 1,000 mL (0 mLs Intravenous Stopped 05/25/14 1225)   methylPREDNISolone sodium succinate (SOLU-MEDROL) 125 mg/2 mL injection 125 mg (125 mg Intravenous Given 05/25/14 1023)  ipratropium-albuterol (DUONEB) 0.5-2.5 (3) MG/3ML nebulizer solution 3 mL (3 mLs Nebulization Given 05/25/14 1023)  chlorpheniramine-HYDROcodone (TUSSIONEX) 10-8 MG/5ML suspension 5 mL (5 mLs Oral Given 05/25/14 1023)  LORazepam (ATIVAN) tablet 0.5 mg (0.5 mg Oral Given 05/25/14 1225)  doxycycline (VIBRA-TABS) tablet 100 mg (100 mg Oral Given 05/25/14 1311)    Vicki Mayo is a 53 y.o. female presenting with productive cough, chest tightness and exacerbation of chronic low back pain. EKG and chest x-ray with no acute abnormalities, QTC is slightly prolonged. Blood work and UA are unremarkable.  I will start the patient on an antibiotic because of the sputum change and her chronic tobacco use.   Patient is requesting refill on her anxiety and chronic  pain medications. States that she is visiting from [place] and usually gets her prescriptions filled every 3 months however she is out and would like a refill until she can follow with her primary care physician. I have explained to the patient we do not refill prescriptions for chronic anxiety and pain medications. I will give her one dose of anxiety meds in the ED.  This is a shared visit with the attending physician who personally evaluated the patient and agrees with the care plan.    Evaluation does not show pathology that would require ongoing emergent intervention or inpatient treatment. Pt is hemodynamically stable and mentating appropriately. Discussed findings and plan with patient/guardian, who agrees with care plan. All questions answered. Return precautions discussed and outpatient follow up given.   Discharge Medication List as of 05/25/2014 12:33 PM    START taking these medications   Details  !! albuterol (PROVENTIL HFA;VENTOLIN HFA) 108 (90 BASE) MCG/ACT inhaler Inhale 2 puffs into the lungs every 4 (four) hours  as needed for wheezing or shortness of breath., Starting 05/25/2014, Until Discontinued, Print    !! amLODipine (NORVASC) 10 MG tablet Take 1 tablet (10 mg total) by mouth daily., Starting 05/25/2014, Until Discontinued, Print    cyclobenzaprine (FLEXERIL) 10 MG tablet Take 1 tablet (10 mg total) by mouth 2 (two) times daily as needed for muscle spasms., Starting 05/25/2014, Until Discontinued, Print    doxycycline (VIBRAMYCIN) 100 MG capsule Take 1 capsule (100 mg total) by mouth 2 (two) times daily., Starting 05/25/2014, Until Discontinued, Print    HYDROcodone-acetaminophen (HYCET) 7.5-325 mg/15 ml solution Take 15 mLs by mouth every 8 (eight) hours as needed for moderate pain., Starting 05/25/2014, Until Discontinued, Print    !! lisinopril-hydrochlorothiazide (PRINZIDE) 10-12.5 MG per tablet Take 1 tablet by mouth daily., Starting 05/25/2014, Until Discontinued, Print    predniSONE (DELTASONE) 50 MG tablet Take 1 tablet daily with breakfast, Print     !! - Potential duplicate medications found. Please discuss with provider.           Wynetta Emery, PA-C 05/25/14 1318

## 2014-05-25 NOTE — ED Provider Notes (Signed)
Medical screening examination/treatment/procedure(s) were conducted as a shared visit with non-physician practitioner(s) and myself.  I personally evaluated the patient during the encounter.   EKG Interpretation   Date/Time:  Thursday May 25 2014 11:33:36 EDT Ventricular Rate:  85 PR Interval:  170 QRS Duration: 89 QT Interval:  448 QTC Calculation: 533 R Axis:   -7 Text Interpretation:  Sinus rhythm Probable anteroseptal infarct, old  Prolonged QT interval compared to prior, QTc longer Confirmed by [facility]   MD, TREY ([id number]) on 05/25/2014 12:35:36 PM      53 year old female with 3 weeks of cough, now with productive sputum.  On exam, well appearing, nontoxic, not distressed, normal respiratory effort, normal perfusion, lungs clear to auscultation bilaterally, heart sounds normal with regular rate and rhythm.  Chest x-ray negative. Plan to discharge and treat for bronchitis. Advised followup with her primary doctor.  Clinical Impression: 1. Cough with sputum   2. Tobacco use disorder   3. Chronic back pain       Candyce Churn III, MD 05/25/14 1330

## 2014-05-25 NOTE — ED Notes (Signed)
Pisciotta, PA at bedside.  

## 2014-05-25 NOTE — ED Notes (Signed)
Patient states congested cough with productive sputum.  Patient states x 2 wks.  Patient also complains of urinary frequency.

## 2014-05-25 NOTE — Discharge Instructions (Signed)
Take Hycet  for cough and pain. Do not drink alcohol, drive, care for children or do other critical tasks while taking Hycet.   Take your antibiotics as directed and to completion. You should never have any leftover antibiotics! Push fluids and stay well hydrated.   Please follow with your primary care doctor in the next 2 days for a check-up. They must obtain records for further management.   Do not hesitate to return to the Emergency Department for any new, worsening or concerning symptoms.    Cough, Adult  A cough is a reflex that helps clear your throat and airways. It can help heal the body or may be a reaction to an irritated airway. A cough may only last 2 or 3 weeks (acute) or may last more than 8 weeks (chronic).  CAUSES Acute cough:  Viral or bacterial infections. Chronic cough:  Infections.  Allergies.  Asthma.  Post-nasal drip.  Smoking.  Heartburn or acid reflux.  Some medicines.  Chronic lung problems (COPD).  Cancer. SYMPTOMS   Cough.  Fever.  Chest pain.  Increased breathing rate.  High-pitched whistling sound when breathing (wheezing).  Colored mucus that you cough up (sputum). TREATMENT   A bacterial cough may be treated with antibiotic medicine.  A viral cough must run its course and will not respond to antibiotics.  Your caregiver may recommend other treatments if you have a chronic cough. HOME CARE INSTRUCTIONS   Only take over-the-counter or prescription medicines for pain, discomfort, or fever as directed by your caregiver. Use cough suppressants only as directed by your caregiver.  Use a cold steam vaporizer or humidifier in your bedroom or home to help loosen secretions.  Sleep in a semi-upright position if your cough is worse at night.  Rest as needed.  Stop smoking if you smoke. SEEK IMMEDIATE MEDICAL CARE IF:   You have pus in your sputum.  Your cough starts to worsen.  You cannot control your cough with suppressants  and are losing sleep.  You begin coughing up blood.  You have difficulty breathing.  You develop pain which is getting worse or is uncontrolled with medicine.  You have a fever. MAKE SURE YOU:   Understand these instructions.  Will watch your condition.  Will get help right away if you are not doing well or get worse. Document Released: 02/14/2011 Document Revised: 11/10/2011 Document Reviewed: 02/14/2011 [facility] Patient Information 2015 [place], [place]. This information is not intended to replace advice given to you by your health care provider. Make sure you discuss any questions you have with your health care provider.

## 2014-05-26 NOTE — ED Provider Notes (Signed)
Medical screening examination/treatment/procedure(s) were conducted as a shared visit with non-physician practitioner(s) and myself.  I personally evaluated the patient during the encounter.   EKG Interpretation   Date/Time:  Thursday May 25 2014 11:33:36 EDT Ventricular Rate:  85 PR Interval:  170 QRS Duration: 89 QT Interval:  448 QTC Calculation: 533 R Axis:   -7 Text Interpretation:  Sinus rhythm Probable anteroseptal infarct, old  Prolonged QT interval compared to prior, QTc longer Confirmed by [facility]   MD, TREY ([id number]) on 05/25/2014 12:35:13 PM        Candyce Churn III, MD 05/26/14 [phone]

## 2014-08-21 ENCOUNTER — Emergency Department (HOSPITAL_COMMUNITY)
Admission: EM | Admit: 2014-08-21 | Discharge: 2014-08-21 | Disposition: A | Discharge status: Home / Self Care | Payer: Medicare Other | Attending: Emergency Medicine | Admitting: Emergency Medicine

## 2014-08-21 ENCOUNTER — Encounter (HOSPITAL_COMMUNITY): Payer: Self-pay | Admitting: *Deleted

## 2014-08-21 ENCOUNTER — Emergency Department (HOSPITAL_COMMUNITY): Payer: Medicare Other

## 2014-08-21 DIAGNOSIS — Z7982 Long term (current) use of aspirin: Secondary | ICD-10-CM | POA: Insufficient documentation

## 2014-08-21 DIAGNOSIS — Y9389 Activity, other specified: Secondary | ICD-10-CM | POA: Insufficient documentation

## 2014-08-21 DIAGNOSIS — S59911A Unspecified injury of right forearm, initial encounter: Secondary | ICD-10-CM | POA: Insufficient documentation

## 2014-08-21 DIAGNOSIS — Z720 Tobacco use: Secondary | ICD-10-CM | POA: Diagnosis not present

## 2014-08-21 DIAGNOSIS — S4992XA Unspecified injury of left shoulder and upper arm, initial encounter: Secondary | ICD-10-CM | POA: Diagnosis not present

## 2014-08-21 DIAGNOSIS — Z79899 Other long term (current) drug therapy: Secondary | ICD-10-CM | POA: Diagnosis not present

## 2014-08-21 DIAGNOSIS — M1990 Unspecified osteoarthritis, unspecified site: Secondary | ICD-10-CM | POA: Insufficient documentation

## 2014-08-21 DIAGNOSIS — S6991XA Unspecified injury of right wrist, hand and finger(s), initial encounter: Secondary | ICD-10-CM | POA: Insufficient documentation

## 2014-08-21 DIAGNOSIS — I10 Essential (primary) hypertension: Secondary | ICD-10-CM | POA: Insufficient documentation

## 2014-08-21 DIAGNOSIS — Y998 Other external cause status: Secondary | ICD-10-CM | POA: Diagnosis not present

## 2014-08-21 DIAGNOSIS — E669 Obesity, unspecified: Secondary | ICD-10-CM | POA: Diagnosis not present

## 2014-08-21 DIAGNOSIS — Y92410 Unspecified street and highway as the place of occurrence of the external cause: Secondary | ICD-10-CM | POA: Insufficient documentation

## 2014-08-21 DIAGNOSIS — S09302A Unspecified injury of left middle and inner ear, initial encounter: Secondary | ICD-10-CM | POA: Insufficient documentation

## 2014-08-21 MED ORDER — METHOCARBAMOL 500 MG PO TABS: 500.0000 mg | ORAL_TABLET | Freq: Two times a day (BID) | ORAL | Status: DC

## 2014-08-21 MED ORDER — MELOXICAM 15 MG PO TABS: 15.0000 mg | ORAL_TABLET | Freq: Every day | ORAL | Status: DC

## 2014-08-21 NOTE — Discharge Instructions (Signed)
Take Mobic as needed for pain. Take Robaxin as needed for muscle spasm. Refer to attached documents for more information. Return to the ED with worsening or concerning symptoms.
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
## 2015-06-06 ENCOUNTER — Emergency Department (HOSPITAL_COMMUNITY)
Admission: EM | Admit: 2015-06-06 | Discharge: 2015-06-06 | Disposition: A | Discharge status: Home / Self Care | Payer: Medicare Other | Attending: Emergency Medicine | Admitting: Emergency Medicine

## 2015-06-06 DIAGNOSIS — E669 Obesity, unspecified: Secondary | ICD-10-CM | POA: Insufficient documentation

## 2015-06-06 DIAGNOSIS — Z792 Long term (current) use of antibiotics: Secondary | ICD-10-CM | POA: Insufficient documentation

## 2015-06-06 DIAGNOSIS — I10 Essential (primary) hypertension: Secondary | ICD-10-CM | POA: Diagnosis not present

## 2015-06-06 DIAGNOSIS — F319 Bipolar disorder, unspecified: Secondary | ICD-10-CM | POA: Insufficient documentation

## 2015-06-06 DIAGNOSIS — N3000 Acute cystitis without hematuria: Secondary | ICD-10-CM | POA: Diagnosis not present

## 2015-06-06 DIAGNOSIS — R1030 Lower abdominal pain, unspecified: Secondary | ICD-10-CM | POA: Diagnosis present

## 2015-06-06 DIAGNOSIS — E785 Hyperlipidemia, unspecified: Secondary | ICD-10-CM | POA: Insufficient documentation

## 2015-06-06 DIAGNOSIS — Z79899 Other long term (current) drug therapy: Secondary | ICD-10-CM | POA: Diagnosis not present

## 2015-06-06 DIAGNOSIS — Z720 Tobacco use: Secondary | ICD-10-CM | POA: Insufficient documentation

## 2015-06-06 LAB — URINE MICROSCOPIC-ADD ON

## 2015-06-06 LAB — CBC WITH DIFFERENTIAL/PLATELET
BASOS ABS: 0 10*3/uL (ref 0.0–0.1)
BASOS PCT: 0 %
EOS ABS: 0.1 10*3/uL (ref 0.0–0.7)
Eosinophils Relative: 1 %
HCT: 39.6 % (ref 36.0–46.0)
HEMOGLOBIN: 13.3 g/dL (ref 12.0–15.0)
Lymphocytes Relative: 25 %
Lymphs Abs: 1.7 10*3/uL (ref 0.7–4.0)
MCH: 29.1 pg (ref 26.0–34.0)
MCHC: 33.6 g/dL (ref 30.0–36.0)
MCV: 86.7 fL (ref 78.0–100.0)
MONOS PCT: 5 %
Monocytes Absolute: 0.3 10*3/uL (ref 0.1–1.0)
NEUTROS PCT: 69 %
Neutro Abs: 4.8 10*3/uL (ref 1.7–7.7)
Platelets: 272 10*3/uL (ref 150–400)
RBC: 4.57 MIL/uL (ref 3.87–5.11)
RDW: 13 % (ref 11.5–15.5)
WBC: 6.9 10*3/uL (ref 4.0–10.5)

## 2015-06-06 LAB — URINALYSIS, ROUTINE W REFLEX MICROSCOPIC
Bilirubin Urine: NEGATIVE
Glucose, UA: NEGATIVE mg/dL
Ketones, ur: NEGATIVE mg/dL
NITRITE: POSITIVE — AB
Protein, ur: 100 mg/dL — AB
Specific Gravity, Urine: 1.018 (ref 1.005–1.030)
Urobilinogen, UA: 0.2 mg/dL (ref 0.0–1.0)
pH: 6 (ref 5.0–8.0)

## 2015-06-06 LAB — COMPREHENSIVE METABOLIC PANEL
ALBUMIN: 3.9 g/dL (ref 3.5–5.0)
ALK PHOS: 91 U/L (ref 38–126)
ALT: 18 U/L (ref 14–54)
ANION GAP: 12 (ref 5–15)
AST: 25 U/L (ref 15–41)
BUN: 5 mg/dL — ABNORMAL LOW (ref 6–20)
CO2: 24 mmol/L (ref 22–32)
Calcium: 9 mg/dL (ref 8.9–10.3)
Chloride: 101 mmol/L (ref 101–111)
Creatinine, Ser: 0.81 mg/dL (ref 0.44–1.00)
GFR calc Af Amer: >60 mL/min (ref >60)
GFR calc non Af Amer: >60 mL/min (ref >60)
Glucose, Bld: 132 mg/dL — ABNORMAL HIGH (ref 65–99)
Potassium: 3.3 mmol/L — ABNORMAL LOW (ref 3.5–5.1)
SODIUM: 137 mmol/L (ref 135–145)
Total Bilirubin: 0.4 mg/dL (ref 0.3–1.2)
Total Protein: 6.8 g/dL (ref 6.5–8.1)

## 2015-06-06 MED ORDER — IBUPROFEN 200 MG PO TABS
600.0000 mg | ORAL_TABLET | Freq: Once | ORAL | Status: AC
  Administered 2015-06-06: 600 mg via ORAL
  Filled 2015-06-06: qty 3

## 2015-06-06 MED ORDER — HYDROCODONE-ACETAMINOPHEN 5-325 MG PO TABS
1.0000 | ORAL_TABLET | Freq: Once | ORAL | Status: AC
  Administered 2015-06-06: 1 via ORAL
  Filled 2015-06-06: qty 1

## 2015-06-06 MED ORDER — CEPHALEXIN 250 MG PO CAPS
500.0000 mg | ORAL_CAPSULE | Freq: Once | ORAL | Status: AC
  Administered 2015-06-06: 500 mg via ORAL
  Filled 2015-06-06: qty 2

## 2015-06-06 MED ORDER — CEPHALEXIN 500 MG PO CAPS: 500.0000 mg | ORAL_CAPSULE | Freq: Three times a day (TID) | ORAL | Status: AC

## 2015-06-06 NOTE — ED Provider Notes (Signed)
CSN: [id number]     Arrival date & time 06/06/15  1146 History   First MD Initiated Contact with Patient 06/06/15 1238     Chief Complaint  Patient presents with  . Abdominal Pain      HPI Patient presents to the emergency department complaining of suprapubic abdominal discomfort and pain which began over the past 48 hours.  She does report some discomfort with urination towards the end of her urination.  She denies urinary frequency.  No fevers but does report chills.  She denies flank pain.  Only one prior episode of urinary tract infection.  She does not remember how that felt.  She denies nausea vomiting.  No diarrhea.  No upper abdominal pain.  No chest pain shortness breath.  Pain is moderate in severity.   Past Medical History  Diagnosis Date  . Hypertension   . OA (osteoarthritis)   . Bipolar disorder   . Dyslipidemia   . Obesity    Past Surgical History  Procedure Laterality Date  . Abdominal hysterectomy     No family history on file. Social History  Substance Use Topics  . Smoking status: Light Tobacco Smoker -- 0.25 packs/day    Types: Cigarettes  . Smokeless tobacco: Not on file  . Alcohol Use: No   OB History    No data available     Review of Systems  All other systems reviewed and are negative.     Allergies  Review of patient's allergies indicates no known allergies.  Home Medications   Prior to Admission medications   Medication Sig Start Date End Date Taking? Authorizing Provider  albuterol (PROVENTIL HFA;VENTOLIN HFA) 108 (90 BASE) MCG/ACT inhaler Inhale 2 puffs into the lungs every 4 (four) hours as needed for wheezing or shortness of breath. 05/25/14  Yes Nicole Pisciotta, PA-C  amLODipine (NORVASC) 10 MG tablet Take 1 tablet (10 mg total) by mouth daily. 05/25/14  Yes Nicole Pisciotta, PA-C  Calcium Carb-Cholecalciferol (CALCIUM 1000 + D) 1000-800 MG-UNIT TABS Take 1 tablet by mouth daily.   Yes Historical Provider, MD  cyclobenzaprine  (FLEXERIL) 10 MG tablet Take 1 tablet (10 mg total) by mouth 2 (two) times daily as needed for muscle spasms. 05/25/14  Yes Nicole Pisciotta, PA-C  PARoxetine (PAXIL) 30 MG tablet Take 30 mg by mouth daily.   Yes Historical Provider, MD  traZODone (DESYREL) 50 MG tablet Take 50 mg by mouth at bedtime. 04/03/15  Yes Historical Provider, MD  cephALEXin (KEFLEX) 500 MG capsule Take 1 capsule (500 mg total) by mouth 3 (three) times daily. 06/06/15   Azalia Bilis, MD  losartan (COZAAR) 50 MG tablet Take 50 mg by mouth daily. 04/03/15   Historical Provider, MD   BP 124/75 mmHg  Pulse 99  Temp(Src) 98.3 F (36.8 C) (Oral)  Ht  (1.626 m)  Wt 195 lb (88.451 kg)  BMI 33.46 kg/m2  SpO2 100% Physical Exam  Constitutional: She is oriented to person, place, and time. She appears well-developed and well-nourished. No distress.  HENT:  Head: Normocephalic and atraumatic.  Eyes: EOM are normal.  Neck: Normal range of motion.  Cardiovascular: Normal rate, regular rhythm and normal heart sounds.   Pulmonary/Chest: Effort normal and breath sounds normal.  Abdominal: Soft. She exhibits no distension. There is no tenderness.  Musculoskeletal: Normal range of motion.  Neurological: She is alert and oriented to person, place, and time.  Skin: Skin is warm and dry.  Psychiatric: She has a normal mood  and affect. Judgment normal.  Nursing note and vitals reviewed.   ED Course  Procedures (including critical care time) Labs Review Labs Reviewed  COMPREHENSIVE METABOLIC PANEL - Abnormal; Notable for the following:    Potassium 3.3 (*)    Glucose, Bld 132 (*)    BUN 5 (*)    All other components within normal limits  URINALYSIS, ROUTINE W REFLEX MICROSCOPIC (NOT AT [facility]) - Abnormal; Notable for the following:    APPearance TURBID (*)    Hgb urine dipstick LARGE (*)    Protein, ur 100 (*)    Nitrite POSITIVE (*)    Leukocytes, UA LARGE (*)    All other components within normal limits  URINE  MICROSCOPIC-ADD ON - Abnormal; Notable for the following:    Bacteria, UA MANY (*)    All other components within normal limits  URINE CULTURE  CBC WITH DIFFERENTIAL/PLATELET    Imaging Review No results found. I have personally reviewed and evaluated these images and lab results as part of my medical decision-making.   EKG Interpretation None      MDM   Final diagnoses:  Acute cystitis without hematuria    Uti. Urine culture sent. Home with keflex. Vitals normal    Azalia Bilis, MD 06/06/15 [phone]

## 2015-06-06 NOTE — Discharge Instructions (Signed)

## 2015-06-06 NOTE — ED Notes (Signed)
Patient states lower abdominal pain and lower back pain started yesterday morning. Patient states has had one bowel movement since Sunday,

## 2015-06-08 LAB — URINE CULTURE

## 2015-07-11 ENCOUNTER — Encounter: Payer: Self-pay | Admitting: Gastroenterology
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
## 2017-05-07 ENCOUNTER — Emergency Department (HOSPITAL_COMMUNITY): Payer: Medicare Other

## 2017-05-07 ENCOUNTER — Emergency Department (HOSPITAL_COMMUNITY)
Admission: EM | Admit: 2017-05-07 | Discharge: 2017-05-07 | Disposition: A | Discharge status: Home / Self Care | Payer: Medicare Other | Attending: Emergency Medicine | Admitting: Emergency Medicine

## 2017-05-07 ENCOUNTER — Encounter (HOSPITAL_COMMUNITY): Payer: Self-pay | Admitting: Emergency Medicine

## 2017-05-07 DIAGNOSIS — Y999 Unspecified external cause status: Secondary | ICD-10-CM | POA: Insufficient documentation

## 2017-05-07 DIAGNOSIS — S4992XA Unspecified injury of left shoulder and upper arm, initial encounter: Secondary | ICD-10-CM | POA: Diagnosis present

## 2017-05-07 DIAGNOSIS — S40012A Contusion of left shoulder, initial encounter: Secondary | ICD-10-CM | POA: Insufficient documentation

## 2017-05-07 DIAGNOSIS — Z79899 Other long term (current) drug therapy: Secondary | ICD-10-CM | POA: Insufficient documentation

## 2017-05-07 DIAGNOSIS — Y939 Activity, unspecified: Secondary | ICD-10-CM | POA: Diagnosis not present

## 2017-05-07 DIAGNOSIS — S46912A Strain of unspecified muscle, fascia and tendon at shoulder and upper arm level, left arm, initial encounter: Secondary | ICD-10-CM

## 2017-05-07 DIAGNOSIS — Z87891 Personal history of nicotine dependence: Secondary | ICD-10-CM | POA: Insufficient documentation

## 2017-05-07 DIAGNOSIS — I10 Essential (primary) hypertension: Secondary | ICD-10-CM | POA: Insufficient documentation

## 2017-05-07 DIAGNOSIS — Y92410 Unspecified street and highway as the place of occurrence of the external cause: Secondary | ICD-10-CM | POA: Diagnosis not present

## 2017-05-07 MED ORDER — IBUPROFEN 600 MG PO TABS: 600.0000 mg | ORAL_TABLET | Freq: Four times a day (QID) | ORAL | 0 refills | Status: AC

## 2017-05-07 MED ORDER — CYCLOBENZAPRINE HCL 10 MG PO TABS: 10.0000 mg | ORAL_TABLET | Freq: Three times a day (TID) | ORAL | 0 refills | Status: AC
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
## 2017-05-07 NOTE — ED Notes (Signed)
Patient given Ginger Ale per RN approval.
